# Patient Record
Sex: Male | Born: 1935 | Race: White | Hispanic: No | Smoking: Former smoker
Health system: Southern US, Community
[De-identification: ages and names within clinical notes are randomized; demographics above are authoritative.]

## PROBLEM LIST (undated history)

## (undated) DIAGNOSIS — IMO0002 Reserved for concepts with insufficient information to code with codable children: Secondary | ICD-10-CM

## (undated) DIAGNOSIS — C801 Malignant (primary) neoplasm, unspecified: Secondary | ICD-10-CM

## (undated) DIAGNOSIS — S12500A Unspecified displaced fracture of sixth cervical vertebra, initial encounter for closed fracture: Secondary | ICD-10-CM

## (undated) DIAGNOSIS — I2699 Other pulmonary embolism without acute cor pulmonale: Secondary | ICD-10-CM

## (undated) DIAGNOSIS — M1990 Unspecified osteoarthritis, unspecified site: Secondary | ICD-10-CM

## (undated) HISTORY — DX: Unspecified displaced fracture of sixth cervical vertebra, initial encounter for closed fracture: S12.500A

## (undated) HISTORY — DX: Reserved for concepts with insufficient information to code with codable children: IMO0002

## (undated) HISTORY — DX: Unspecified osteoarthritis, unspecified site: M19.90

## (undated) HISTORY — PX: FACIAL RECONSTRUCTION SURGERY: SHX631

## (undated) HISTORY — PX: OTHER SURGICAL HISTORY: SHX169

---

## 1999-08-19 ENCOUNTER — Emergency Department (HOSPITAL_COMMUNITY): Admission: EM | Admit: 1999-08-19 | Discharge: 1999-08-19 | Payer: Self-pay | Admitting: Emergency Medicine

## 2001-02-17 ENCOUNTER — Ambulatory Visit (HOSPITAL_COMMUNITY): Admission: RE | Admit: 2001-02-17 | Discharge: 2001-02-17 | Payer: Self-pay | Admitting: Internal Medicine

## 2001-05-16 ENCOUNTER — Encounter: Payer: Self-pay | Admitting: Internal Medicine

## 2001-05-16 ENCOUNTER — Ambulatory Visit (HOSPITAL_COMMUNITY): Admission: RE | Admit: 2001-05-16 | Discharge: 2001-05-16 | Payer: Self-pay | Admitting: Internal Medicine

## 2002-08-18 ENCOUNTER — Ambulatory Visit (HOSPITAL_COMMUNITY): Admission: RE | Admit: 2002-08-18 | Discharge: 2002-08-18 | Payer: Self-pay | Admitting: Internal Medicine

## 2005-01-03 ENCOUNTER — Inpatient Hospital Stay (HOSPITAL_COMMUNITY): Admission: AC | Admit: 2005-01-03 | Discharge: 2005-01-04 | Payer: Self-pay

## 2005-02-25 ENCOUNTER — Encounter: Admission: RE | Admit: 2005-02-25 | Discharge: 2005-02-25 | Payer: Self-pay | Admitting: Orthopedic Surgery

## 2005-10-31 ENCOUNTER — Emergency Department (HOSPITAL_COMMUNITY): Admission: EM | Admit: 2005-10-31 | Discharge: 2005-10-31 | Payer: Self-pay | Admitting: Emergency Medicine

## 2005-11-06 ENCOUNTER — Ambulatory Visit (HOSPITAL_COMMUNITY): Admission: RE | Admit: 2005-11-06 | Discharge: 2005-11-06 | Payer: Self-pay | Admitting: Internal Medicine

## 2005-12-30 DIAGNOSIS — I2699 Other pulmonary embolism without acute cor pulmonale: Secondary | ICD-10-CM

## 2005-12-30 DIAGNOSIS — S12500A Unspecified displaced fracture of sixth cervical vertebra, initial encounter for closed fracture: Secondary | ICD-10-CM

## 2005-12-30 HISTORY — DX: Other pulmonary embolism without acute cor pulmonale: I26.99

## 2005-12-30 HISTORY — DX: Unspecified displaced fracture of sixth cervical vertebra, initial encounter for closed fracture: S12.500A

## 2006-01-22 ENCOUNTER — Encounter: Admission: RE | Admit: 2006-01-22 | Discharge: 2006-01-22 | Payer: Self-pay | Admitting: Thoracic Surgery

## 2006-05-14 ENCOUNTER — Encounter: Admission: RE | Admit: 2006-05-14 | Discharge: 2006-05-14 | Payer: Self-pay | Admitting: Thoracic Surgery

## 2006-08-15 ENCOUNTER — Inpatient Hospital Stay (HOSPITAL_COMMUNITY): Admission: EM | Admit: 2006-08-15 | Discharge: 2006-08-20 | Payer: Self-pay | Admitting: Emergency Medicine

## 2006-08-18 ENCOUNTER — Ambulatory Visit: Payer: Self-pay | Admitting: Physical Medicine & Rehabilitation

## 2006-10-22 ENCOUNTER — Inpatient Hospital Stay (HOSPITAL_COMMUNITY): Admission: AD | Admit: 2006-10-22 | Discharge: 2006-10-24 | Payer: Self-pay | Admitting: Internal Medicine

## 2006-10-23 ENCOUNTER — Encounter: Payer: Self-pay | Admitting: Vascular Surgery

## 2006-12-03 ENCOUNTER — Encounter: Admission: RE | Admit: 2006-12-03 | Discharge: 2006-12-03 | Payer: Self-pay | Admitting: Thoracic Surgery

## 2007-05-23 ENCOUNTER — Inpatient Hospital Stay (HOSPITAL_COMMUNITY): Admission: AC | Admit: 2007-05-23 | Discharge: 2007-05-29 | Payer: Self-pay

## 2007-05-26 ENCOUNTER — Ambulatory Visit: Payer: Self-pay | Admitting: Physical Medicine & Rehabilitation

## 2007-06-24 ENCOUNTER — Ambulatory Visit: Payer: Self-pay | Admitting: Thoracic Surgery

## 2007-06-24 ENCOUNTER — Encounter: Admission: RE | Admit: 2007-06-24 | Discharge: 2007-06-24 | Payer: Self-pay | Admitting: General Surgery

## 2007-07-07 ENCOUNTER — Encounter: Admission: RE | Admit: 2007-07-07 | Discharge: 2007-07-07 | Payer: Self-pay | Admitting: Otolaryngology

## 2007-12-16 ENCOUNTER — Ambulatory Visit: Payer: Self-pay | Admitting: Thoracic Surgery

## 2007-12-16 ENCOUNTER — Encounter: Admission: RE | Admit: 2007-12-16 | Discharge: 2007-12-16 | Payer: Self-pay | Admitting: Thoracic Surgery

## 2008-06-15 ENCOUNTER — Encounter: Admission: RE | Admit: 2008-06-15 | Discharge: 2008-06-15 | Payer: Self-pay | Admitting: Thoracic Surgery

## 2008-06-15 ENCOUNTER — Ambulatory Visit: Payer: Self-pay | Admitting: Thoracic Surgery

## 2008-07-15 ENCOUNTER — Inpatient Hospital Stay (HOSPITAL_COMMUNITY): Admission: RE | Admit: 2008-07-15 | Discharge: 2008-07-18 | Payer: Self-pay | Admitting: Orthopedic Surgery

## 2008-07-15 HISTORY — PX: OTHER SURGICAL HISTORY: SHX169

## 2008-12-14 ENCOUNTER — Encounter: Admission: RE | Admit: 2008-12-14 | Discharge: 2008-12-14 | Payer: Self-pay | Admitting: Thoracic Surgery

## 2008-12-14 ENCOUNTER — Ambulatory Visit: Payer: Self-pay | Admitting: Thoracic Surgery

## 2009-09-13 ENCOUNTER — Encounter: Admission: RE | Admit: 2009-09-13 | Discharge: 2009-09-13 | Payer: Self-pay | Admitting: Thoracic Surgery

## 2009-09-13 ENCOUNTER — Ambulatory Visit: Payer: Self-pay | Admitting: Thoracic Surgery

## 2009-09-18 ENCOUNTER — Ambulatory Visit (HOSPITAL_COMMUNITY): Admission: RE | Admit: 2009-09-18 | Discharge: 2009-09-18 | Payer: Self-pay | Admitting: Thoracic Surgery

## 2009-09-20 ENCOUNTER — Ambulatory Visit: Payer: Self-pay | Admitting: Thoracic Surgery

## 2009-09-20 ENCOUNTER — Ambulatory Visit (HOSPITAL_COMMUNITY): Admission: RE | Admit: 2009-09-20 | Discharge: 2009-09-20 | Payer: Self-pay | Admitting: Thoracic Surgery

## 2009-09-27 ENCOUNTER — Ambulatory Visit (HOSPITAL_COMMUNITY): Admission: RE | Admit: 2009-09-27 | Discharge: 2009-09-27 | Payer: Self-pay | Admitting: Thoracic Surgery

## 2009-09-27 ENCOUNTER — Encounter (INDEPENDENT_AMBULATORY_CARE_PROVIDER_SITE_OTHER): Payer: Self-pay | Admitting: Interventional Radiology

## 2009-09-27 DIAGNOSIS — C801 Malignant (primary) neoplasm, unspecified: Secondary | ICD-10-CM

## 2009-09-27 HISTORY — DX: Malignant (primary) neoplasm, unspecified: C80.1

## 2009-10-03 ENCOUNTER — Ambulatory Visit: Payer: Self-pay | Admitting: Thoracic Surgery

## 2009-10-05 ENCOUNTER — Ambulatory Visit: Admission: RE | Admit: 2009-10-05 | Discharge: 2009-12-27 | Payer: Self-pay | Admitting: Radiation Oncology

## 2009-10-13 DIAGNOSIS — IMO0001 Reserved for inherently not codable concepts without codable children: Secondary | ICD-10-CM

## 2009-10-13 HISTORY — DX: Reserved for inherently not codable concepts without codable children: IMO0001

## 2009-10-18 ENCOUNTER — Inpatient Hospital Stay (HOSPITAL_COMMUNITY): Admission: AD | Admit: 2009-10-18 | Discharge: 2009-10-24 | Payer: Self-pay | Admitting: Thoracic Surgery

## 2009-10-19 ENCOUNTER — Encounter: Payer: Self-pay | Admitting: Thoracic Surgery

## 2009-10-19 ENCOUNTER — Ambulatory Visit: Payer: Self-pay | Admitting: Thoracic Surgery

## 2009-10-19 HISTORY — PX: OTHER SURGICAL HISTORY: SHX169

## 2009-11-01 ENCOUNTER — Encounter: Admission: RE | Admit: 2009-11-01 | Discharge: 2009-11-01 | Payer: Self-pay | Admitting: Thoracic Surgery

## 2009-11-01 ENCOUNTER — Ambulatory Visit: Payer: Self-pay | Admitting: Thoracic Surgery

## 2009-11-15 ENCOUNTER — Encounter: Admission: RE | Admit: 2009-11-15 | Discharge: 2009-11-15 | Payer: Self-pay | Admitting: Thoracic Surgery

## 2009-11-15 ENCOUNTER — Ambulatory Visit: Payer: Self-pay | Admitting: Thoracic Surgery

## 2009-12-27 ENCOUNTER — Encounter: Admission: RE | Admit: 2009-12-27 | Discharge: 2009-12-27 | Payer: Self-pay | Admitting: Thoracic Surgery

## 2009-12-27 ENCOUNTER — Ambulatory Visit: Payer: Self-pay | Admitting: Thoracic Surgery

## 2010-02-14 ENCOUNTER — Other Ambulatory Visit: Payer: Self-pay | Admitting: Radiation Oncology

## 2010-02-15 ENCOUNTER — Ambulatory Visit (HOSPITAL_COMMUNITY): Admission: RE | Admit: 2010-02-15 | Discharge: 2010-02-15 | Payer: Self-pay | Admitting: Radiation Oncology

## 2010-03-23 ENCOUNTER — Ambulatory Visit: Payer: Self-pay | Admitting: Thoracic Surgery

## 2010-03-28 IMAGING — CR DG CHEST 1V PORT
1 series · 1 of 1 positions shown · non-contrast
Comparison: Chest radiograph 10/18/2009

CLINICAL DATA: Right lung mass

PORTABLE CHEST - 1 VIEW

[AP]
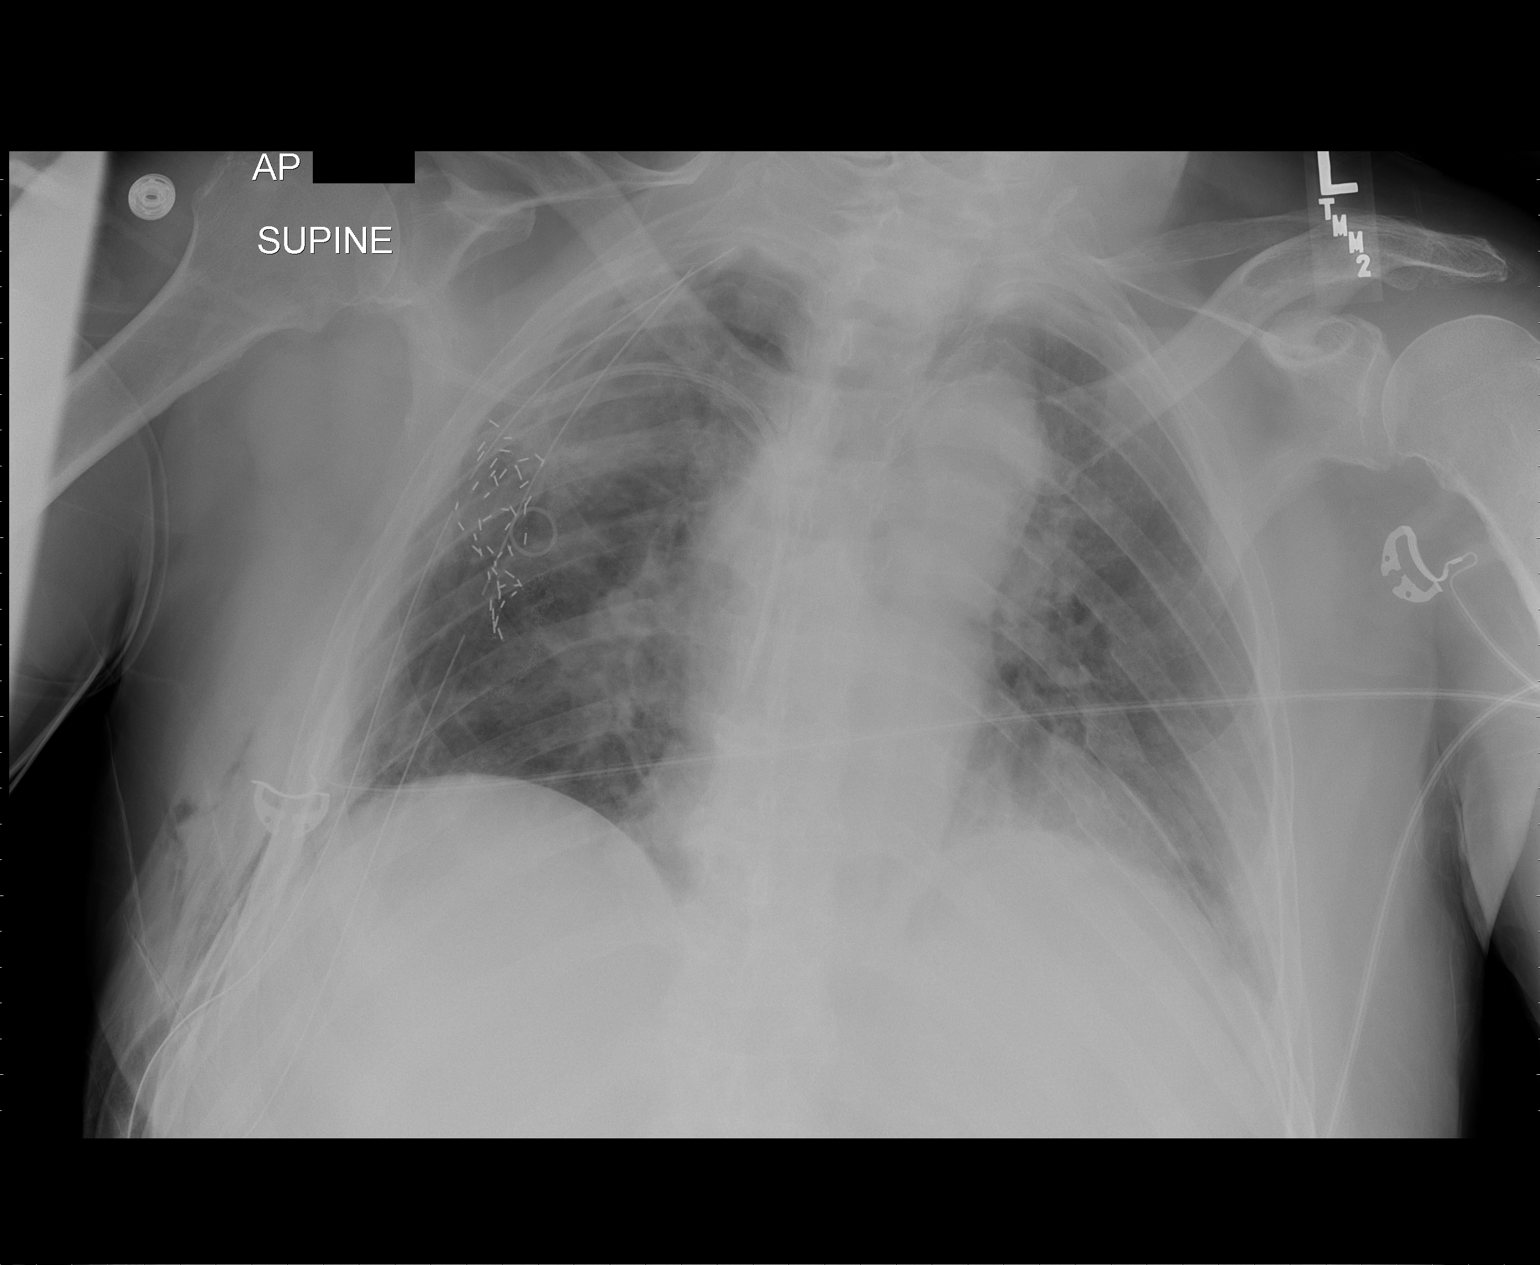

[1 of 1 positions shown; findings below may reference images not displayed]

FINDINGS: Interval placement of a right-sided central venous line
with tip in the distal SVC.  Two right chest tubes in place.  Small
volume subcutaneous gas along the right lateral chest wall.  There
are brachytherapy seeds in the right lateral lung.  No evidence
pneumothorax.  Bibasilar atelectasis.
IMPRESSION: No evidence of complication following right lung surgery with
brachytherapy.

## 2010-03-30 IMAGING — CR DG CHEST 1V PORT
1 series · 1 of 1 positions shown · non-contrast
Comparison: the previous day's study

CLINICAL DATA: Right lung mass

PORTABLE CHEST - 1 VIEW

[view not recorded]
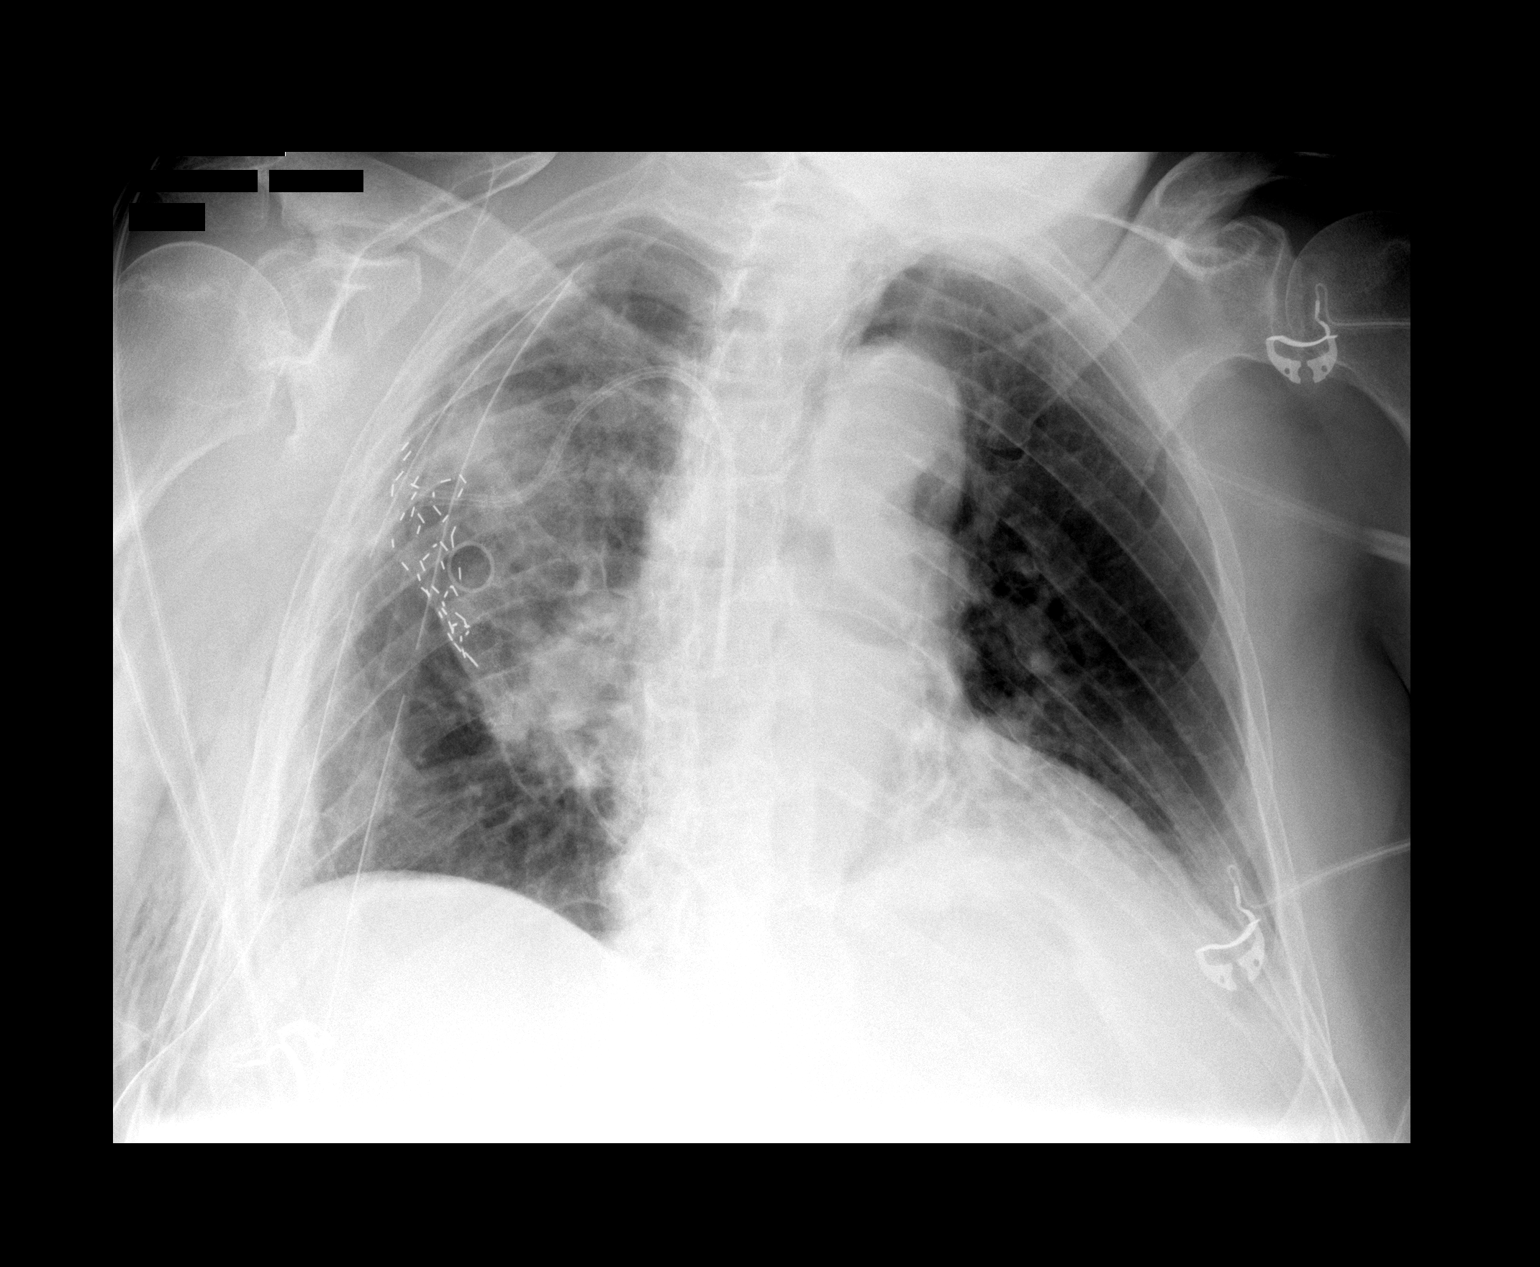

[1 of 1 positions shown; findings below may reference images not displayed]

FINDINGS: Two right chest tubes remain in place with no
pneumothorax.  Radiation seeds project in the right midlung.  Right
subclavian port catheter stable.  Some increase in airspace
opacities in the right mid and upper lung.  Low lung volumes with
resultant crowding of bronchovascular structures.  Cannot exclude
mild pulmonary vascular congestion.  Some increase in patchy
atelectasis or developing infiltrate at the left lung base.  No
definite effusion.  Heart size upper limits normal.
IMPRESSION: 1.  Worsening airspace opacities in the right mid and upper lung
and at the left lung base.
2. Support hardware stable in position.

## 2010-03-31 IMAGING — CR DG CHEST 1V PORT
1 series · 1 of 1 positions shown · non-contrast
Comparison: 10/21/2009

CLINICAL DATA: Right lung cancer.  Right lung mass.  Follow-up
VATS.  Thoracotomy.

PORTABLE CHEST - 1 VIEW

[view not recorded]
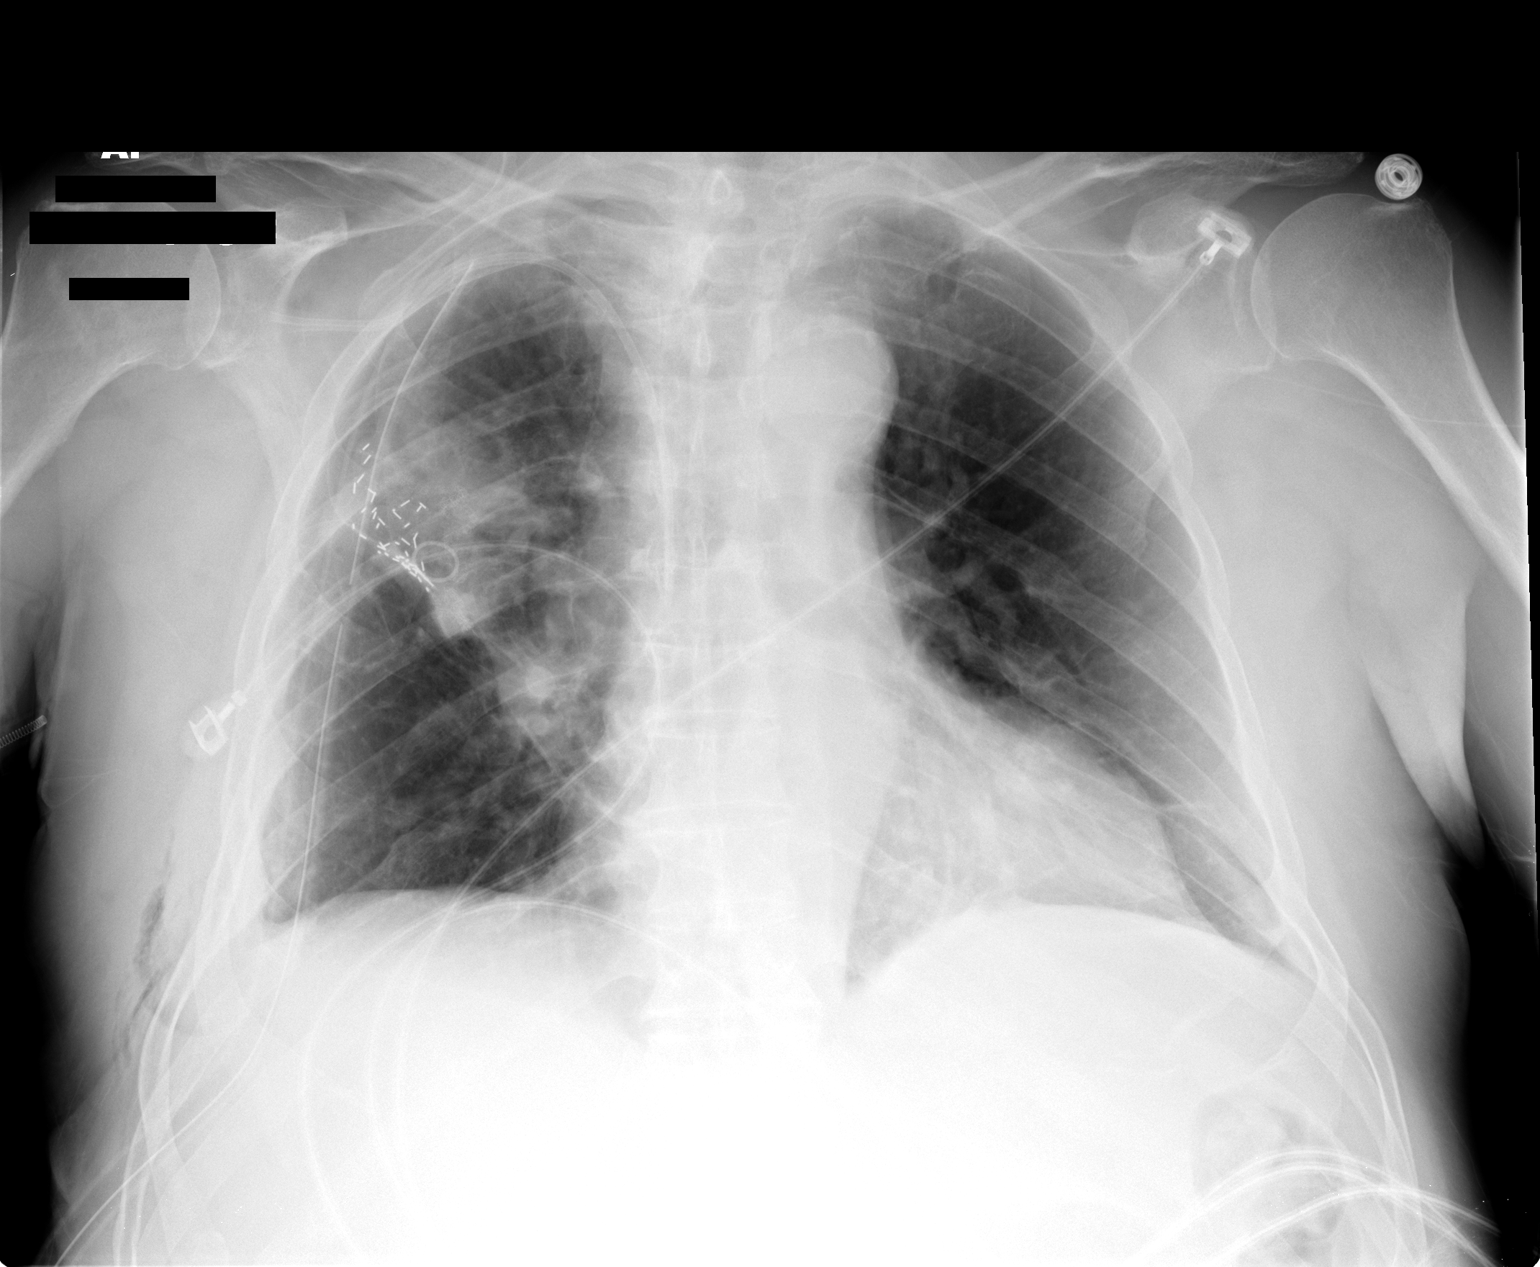

[1 of 1 positions shown; findings below may reference images not displayed]

FINDINGS: Patient has had removal of [DATE] two right-sided chest
tubes.  Right central line tip overlies the level of superior vena
cava.  Right radiation seeds are in place.  There has been some
improvement in right upper lobe air space filling.  There is a
small right pneumothorax.  There is minimal left base atelectasis.
IMPRESSION: Small right pneumothorax following removal of right chest tube.

## 2010-04-24 IMAGING — CR DG CHEST 2V
2 series · 2 of 2 positions shown · non-contrast
Comparison: 11/01/2009

CLINICAL DATA: Lung cancer.

CHEST - 2 VIEW

[w chest pa]
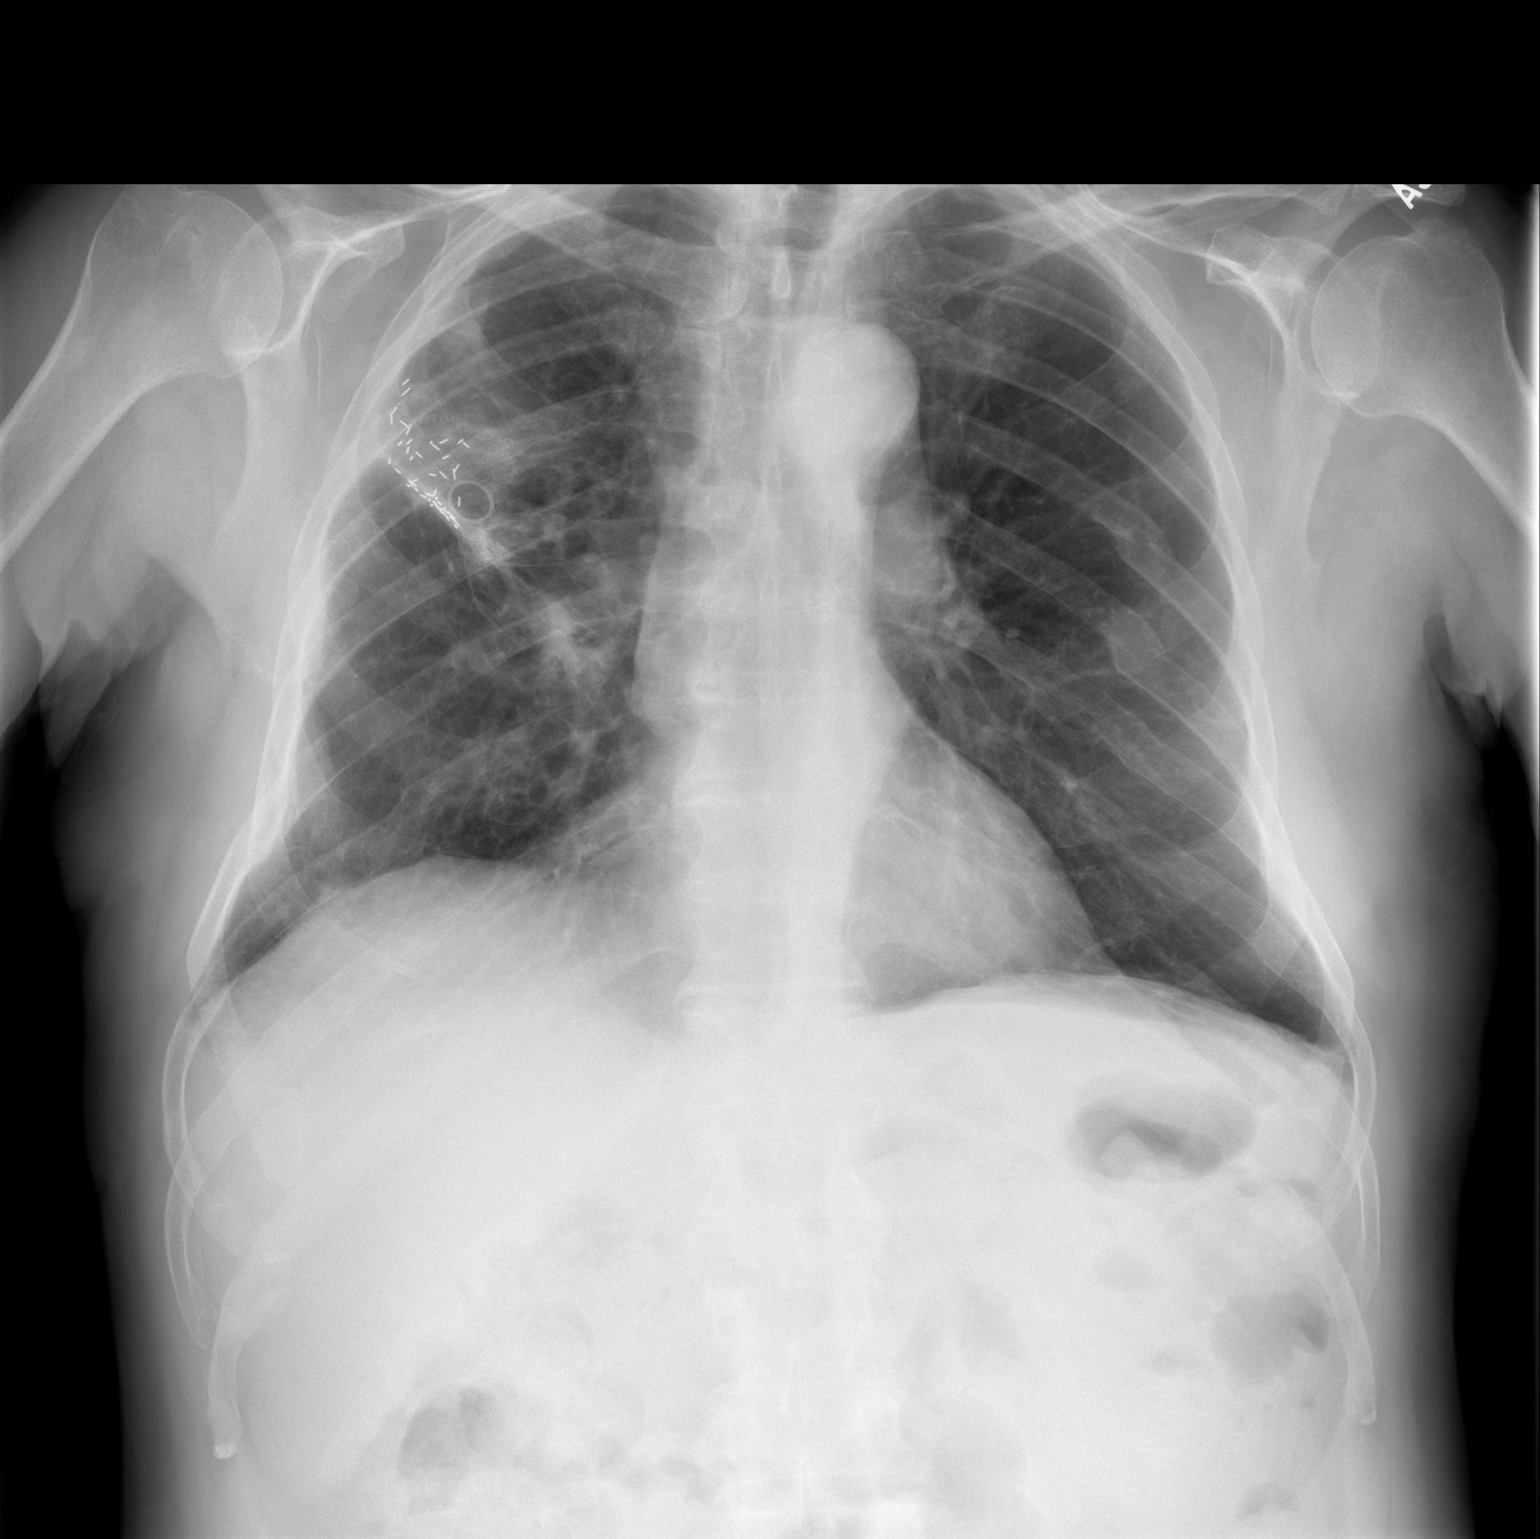

[w chest lat]
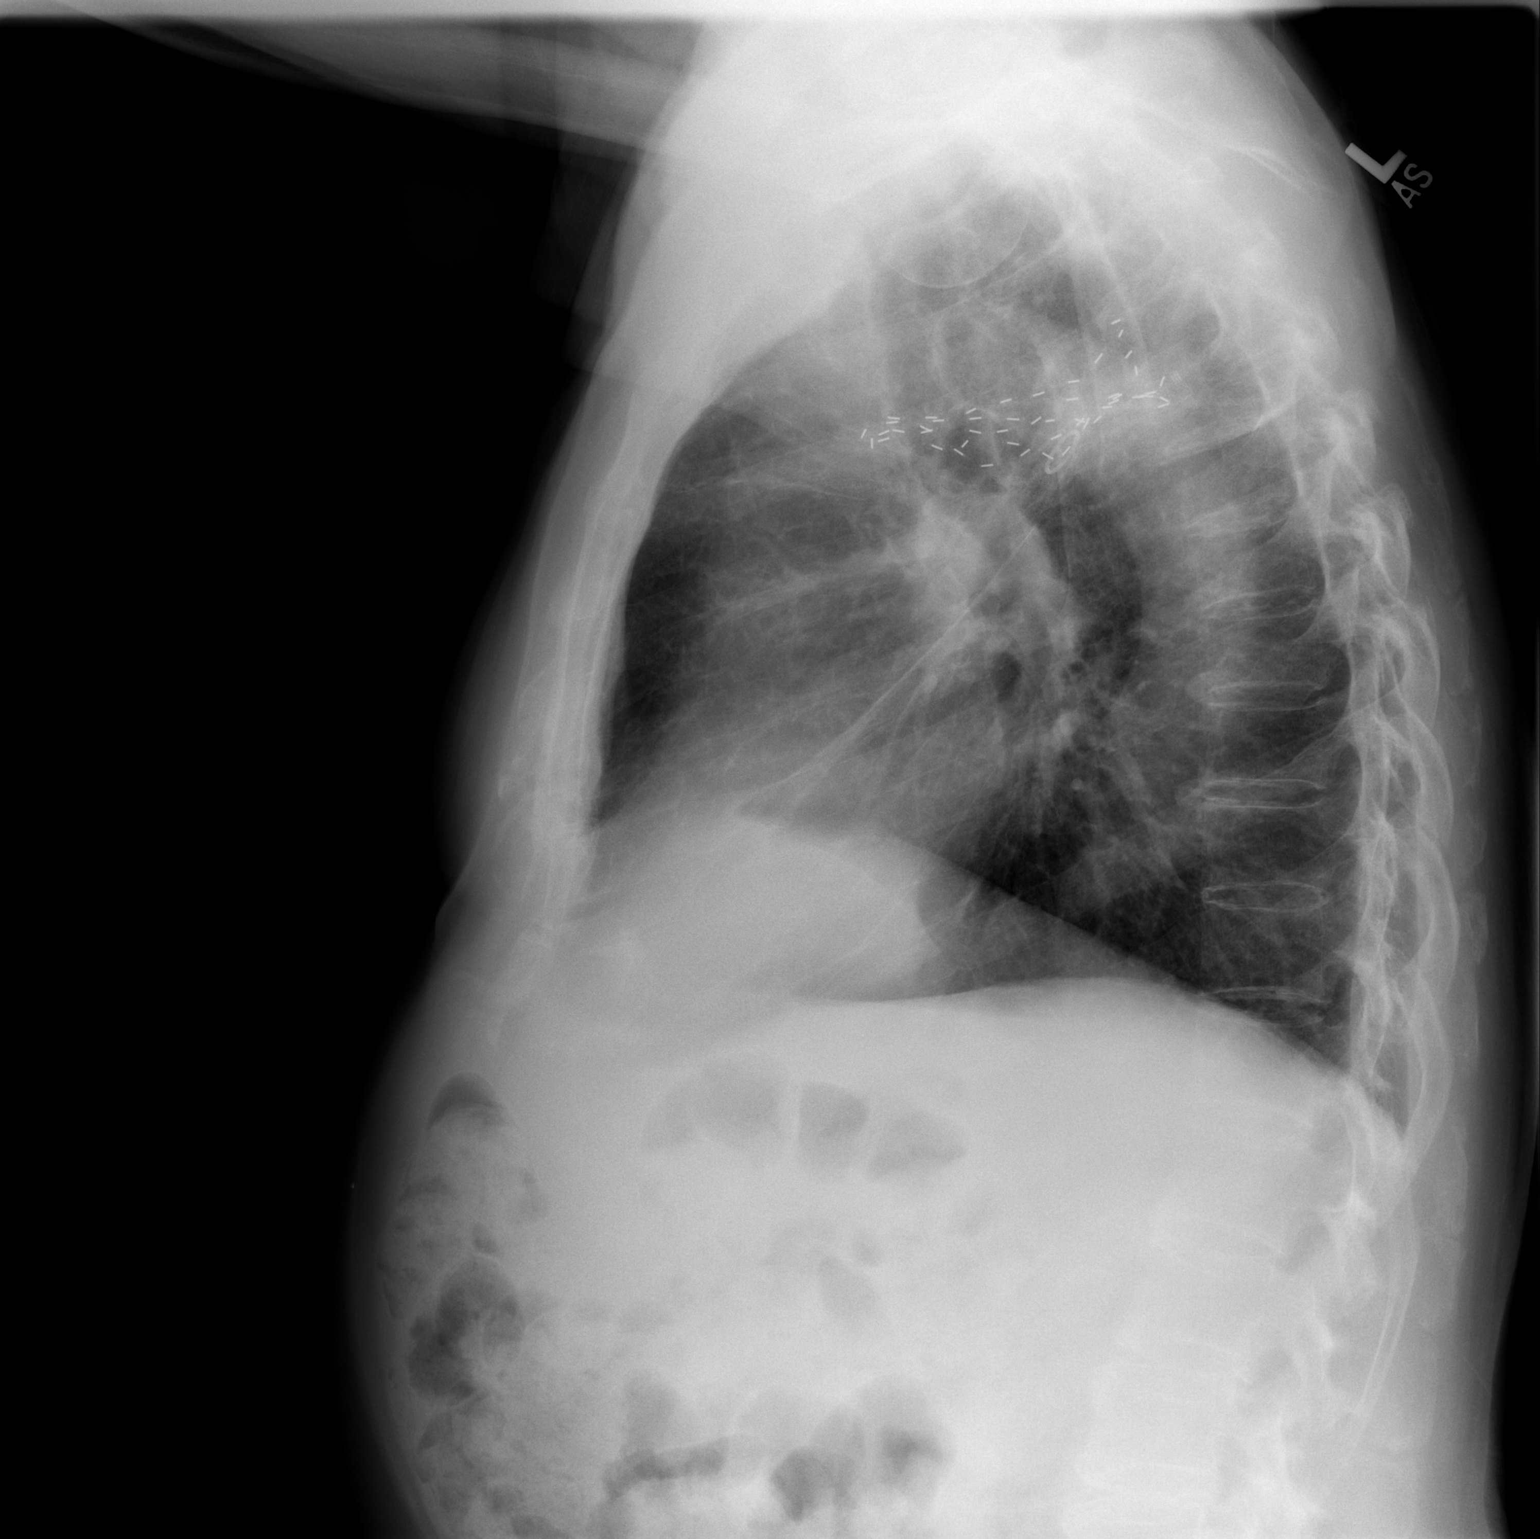

[2 of 2 positions shown; findings below may reference images not displayed]

FINDINGS: Trachea is midline.  Heart size normal.  There are
postoperative changes and radioactive seed placement in right
hemithorax, with associated volume loss.  Left lung is clear.  Old
left rib fractures.  No pleural fluid.
IMPRESSION: Postoperative changes with radioactive seed placement in the right
hemithorax without interval change.

## 2010-05-18 ENCOUNTER — Ambulatory Visit: Admission: RE | Admit: 2010-05-18 | Discharge: 2010-05-18 | Payer: Self-pay | Admitting: Radiation Oncology

## 2010-05-24 ENCOUNTER — Ambulatory Visit (HOSPITAL_COMMUNITY): Admission: RE | Admit: 2010-05-24 | Discharge: 2010-05-24 | Payer: Self-pay | Admitting: Radiation Oncology

## 2010-05-30 ENCOUNTER — Ambulatory Visit: Payer: Self-pay | Admitting: Thoracic Surgery

## 2010-06-08 ENCOUNTER — Ambulatory Visit: Admission: RE | Admit: 2010-06-08 | Discharge: 2010-06-08 | Payer: Self-pay | Admitting: Orthopaedic Surgery

## 2010-06-08 ENCOUNTER — Ambulatory Visit: Payer: Self-pay | Admitting: Vascular Surgery

## 2010-10-04 ENCOUNTER — Ambulatory Visit (HOSPITAL_COMMUNITY): Admission: RE | Admit: 2010-10-04 | Discharge: 2010-10-04 | Payer: Self-pay | Admitting: Radiation Oncology

## 2010-10-04 ENCOUNTER — Other Ambulatory Visit: Payer: Self-pay | Admitting: Radiation Oncology

## 2010-10-04 LAB — BUN: BUN: 13 mg/dL (ref 6–23)

## 2010-12-04 ENCOUNTER — Ambulatory Visit: Payer: Self-pay | Admitting: Thoracic Surgery

## 2011-01-18 ENCOUNTER — Other Ambulatory Visit: Payer: Self-pay | Admitting: Radiation Oncology

## 2011-01-18 DIAGNOSIS — C3490 Malignant neoplasm of unspecified part of unspecified bronchus or lung: Secondary | ICD-10-CM

## 2011-01-19 ENCOUNTER — Encounter: Payer: Self-pay | Admitting: Radiation Oncology

## 2011-01-20 ENCOUNTER — Encounter: Payer: Self-pay | Admitting: Thoracic Surgery

## 2011-01-20 ENCOUNTER — Encounter: Payer: Self-pay | Admitting: Radiation Oncology

## 2011-04-04 LAB — BASIC METABOLIC PANEL
BUN: 13 mg/dL (ref 6–23)
CO2: 25 mEq/L (ref 19–32)
CO2: 29 mEq/L (ref 19–32)
Chloride: 100 mEq/L (ref 96–112)
Chloride: 98 mEq/L (ref 96–112)
Creatinine, Ser: 0.87 mg/dL (ref 0.4–1.5)
Creatinine, Ser: 0.95 mg/dL (ref 0.4–1.5)
GFR calc Af Amer: >60 mL/min (ref >60)
Glucose, Bld: 169 mg/dL — ABNORMAL HIGH (ref 70–99)
Sodium: 132 mEq/L — ABNORMAL LOW (ref 135–145)

## 2011-04-04 LAB — GLUCOSE, CAPILLARY
Glucose-Capillary: 111 mg/dL — ABNORMAL HIGH (ref 70–99)
Glucose-Capillary: 134 mg/dL — ABNORMAL HIGH (ref 70–99)
Glucose-Capillary: 146 mg/dL — ABNORMAL HIGH (ref 70–99)
Glucose-Capillary: 154 mg/dL — ABNORMAL HIGH (ref 70–99)
Glucose-Capillary: 159 mg/dL — ABNORMAL HIGH (ref 70–99)
Glucose-Capillary: 160 mg/dL — ABNORMAL HIGH (ref 70–99)
Glucose-Capillary: 162 mg/dL — ABNORMAL HIGH (ref 70–99)
Glucose-Capillary: 190 mg/dL — ABNORMAL HIGH (ref 70–99)

## 2011-04-04 LAB — CBC
HCT: 32.9 % — ABNORMAL LOW (ref 39.0–52.0)
HCT: 34.9 % — ABNORMAL LOW (ref 39.0–52.0)
HCT: 39 % (ref 39.0–52.0)
Hemoglobin: 12 g/dL — ABNORMAL LOW (ref 13.0–17.0)
MCHC: 34.3 g/dL (ref 30.0–36.0)
MCHC: 34.4 g/dL (ref 30.0–36.0)
MCV: 91.7 fL (ref 78.0–100.0)
MCV: 92.9 fL (ref 78.0–100.0)
MCV: 93 fL (ref 78.0–100.0)
MCV: 93.6 fL (ref 78.0–100.0)
Platelets: 161 10*3/uL (ref 150–400)
RBC: 3.54 MIL/uL — ABNORMAL LOW (ref 4.22–5.81)
RBC: 3.75 MIL/uL — ABNORMAL LOW (ref 4.22–5.81)
RBC: 4.24 MIL/uL (ref 4.22–5.81)
WBC: 11.3 10*3/uL — ABNORMAL HIGH (ref 4.0–10.5)
WBC: 7.1 10*3/uL (ref 4.0–10.5)

## 2011-04-04 LAB — COMPREHENSIVE METABOLIC PANEL
AST: 22 U/L (ref 0–37)
Alkaline Phosphatase: 42 U/L (ref 39–117)
BUN: 10 mg/dL (ref 6–23)
BUN: 12 mg/dL (ref 6–23)
CO2: 24 mEq/L (ref 19–32)
CO2: 28 mEq/L (ref 19–32)
Chloride: 100 mEq/L (ref 96–112)
Chloride: 104 mEq/L (ref 96–112)
Creatinine, Ser: 0.91 mg/dL (ref 0.4–1.5)
Creatinine, Ser: 0.91 mg/dL (ref 0.4–1.5)
GFR calc non Af Amer: >60 mL/min (ref >60)
GFR calc non Af Amer: >60 mL/min (ref >60)
Glucose, Bld: 134 mg/dL — ABNORMAL HIGH (ref 70–99)
Potassium: 4.6 mEq/L (ref 3.5–5.1)
Total Bilirubin: 0.6 mg/dL (ref 0.3–1.2)
Total Bilirubin: 0.8 mg/dL (ref 0.3–1.2)

## 2011-04-04 LAB — PROTIME-INR
INR: 0.99 (ref 0.00–1.49)
INR: 1.06 (ref 0.00–1.49)
Prothrombin Time: 13 seconds (ref 11.6–15.2)
Prothrombin Time: 13.7 seconds (ref 11.6–15.2)
Prothrombin Time: 14 seconds (ref 11.6–15.2)

## 2011-04-04 LAB — APTT: aPTT: 26 seconds (ref 24–37)

## 2011-04-04 LAB — POCT I-STAT 3, ART BLOOD GAS (G3+)
O2 Saturation: 97 %
Patient temperature: 98.6
TCO2: 25 mmol/L (ref 0–100)

## 2011-04-04 LAB — CROSSMATCH: Weak D: POSITIVE

## 2011-04-05 LAB — CBC
HCT: 38.7 % — ABNORMAL LOW (ref 39.0–52.0)
MCHC: 33.9 g/dL (ref 30.0–36.0)
Platelets: 181 10*3/uL (ref 150–400)
RDW: 13 % (ref 11.5–15.5)

## 2011-04-05 LAB — APTT: aPTT: 26 seconds (ref 24–37)

## 2011-04-11 ENCOUNTER — Ambulatory Visit: Payer: Medicare Other | Attending: Radiation Oncology | Admitting: Radiation Oncology

## 2011-04-11 ENCOUNTER — Encounter (HOSPITAL_COMMUNITY): Payer: Self-pay

## 2011-04-11 ENCOUNTER — Ambulatory Visit (HOSPITAL_COMMUNITY)
Admission: RE | Admit: 2011-04-11 | Discharge: 2011-04-11 | Disposition: A | Discharge status: Home / Self Care | Payer: Medicare Other | Source: Ambulatory Visit | Attending: Radiation Oncology | Admitting: Radiation Oncology

## 2011-04-11 DIAGNOSIS — J984 Other disorders of lung: Secondary | ICD-10-CM | POA: Insufficient documentation

## 2011-04-11 DIAGNOSIS — I2510 Atherosclerotic heart disease of native coronary artery without angina pectoris: Secondary | ICD-10-CM | POA: Insufficient documentation

## 2011-04-11 DIAGNOSIS — K7689 Other specified diseases of liver: Secondary | ICD-10-CM | POA: Insufficient documentation

## 2011-04-11 DIAGNOSIS — C3490 Malignant neoplasm of unspecified part of unspecified bronchus or lung: Secondary | ICD-10-CM | POA: Insufficient documentation

## 2011-04-11 HISTORY — DX: Malignant (primary) neoplasm, unspecified: C80.1

## 2011-04-11 LAB — BUN & CREATININE (CHCC)
BUN, Bld: 14 mg/dL (ref 7–22)
Creat: 0.9 mg/dl (ref 0.6–1.2)

## 2011-04-11 MED ORDER — IOHEXOL 300 MG/ML  SOLN
80.0000 mL | Freq: Once | INTRAMUSCULAR | Status: AC | PRN
  Administered 2011-04-11: 80 mL via INTRAVENOUS

## 2011-04-18 ENCOUNTER — Ambulatory Visit: Payer: Medicare Other | Attending: Radiation Oncology | Admitting: Radiation Oncology

## 2011-05-01 ENCOUNTER — Other Ambulatory Visit: Payer: Self-pay | Admitting: Dermatology

## 2011-05-02 ENCOUNTER — Other Ambulatory Visit: Payer: Self-pay | Admitting: Thoracic Surgery

## 2011-05-02 DIAGNOSIS — C3490 Malignant neoplasm of unspecified part of unspecified bronchus or lung: Secondary | ICD-10-CM
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
## 2011-05-14 NOTE — Letter (Signed)
September 13, 2009   Geoffry Paradise, MD  [address]  [place], [place] [address]   Re:  Allen [name], Allen [name]              DOB:  12-14-1923   Dear Dr. Jacky Kindle;   I saw the patient back today for followup of this right upper lobe  lesion that we have been following, we last got a CT scan 9 months ago  and one today does show that there is probably some slight increase in  the right upper lobe lesion, although his previous PET scan was  negative, I am going to repeat his PET scan and get pulmonary function  tests.  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  His blood  pressure is 123/74, pulse 84, respirations 18, sats were 97%.   Ines Bloomer, M.D.  Electronically Signed   DPB/MEDQ  D:  09/13/2009  T:  09/14/2009  Job:  [id number]
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
## 2011-05-14 NOTE — H&P (Signed)
NAMEMEDARD, DECUIR NO.:  [id number]   MEDICAL RECORD NO.:  [id number]        PATIENT TYPE:  CINP   LOCATION:                               FACILITY:  [facility]   PHYSICIAN:  Almedia Balls. Ranell Patrick, M.D. DATE OF BIRTH:  1923/01/03   DATE OF ADMISSION:  07/15/2008  DATE OF DISCHARGE:                              HISTORY & PHYSICAL   CHIEF COMPLAINT:  Left hip pain.   HISTORY OF PRESENT ILLNESS:  The patient is an 77 year old male with  worsening left hip pain.  It has been refractory to conservative  treatment.  The patient has elected have a left total hip arthroplasty.   PAST MEDICAL HISTORY:  Significant for history of bilateral pulmonary  embolus.   FAMILY MEDICAL HISTORY:  Negative.   SOCIAL HISTORY:  The patient of Dr. Jacky Kindle, does not smoke or use  alcohol.   DRUG ALLERGIES:  None.   CURRENT MEDICATIONS:  1. Fish oil.  2. Aspirin 81 mg p.o. daily.   REVIEW OF SYSTEMS:  Pain with ambulation.   PHYSICAL EXAMINATION:  VITAL SIGNS:  Pulse 74, respirations 16, and  blood pressure 130/80.  GENERAL:  The patient is a healthy-appearing 77 year old male, with no  acute distress.  Pleasant mood and affect, oriented x3.  HEAD AND NECK:  Cranial nerves II through XII are grossly intact.  NECK:  Full range of motion without any tenderness.  CHEST:  Active breath sounds bilaterally.  No wheezes, rhonchi, or  rales.  ABDOMEN:  Nontender with nondistended.  HEART:  Regular rate and rhythm.  No murmur.  EXTREMITIES:  Moderate tenderness with internal and external rotation of  the left hip.  He does have a mildly antalgic gait.  SKIN:  No rashes.  He has no pedal edema distally.   LABORATORY DATA:  X-rays show end-stage osteoarthritis of the left hip.   IMPRESSION:  End-stage osteoarthritis of the left hip.   PLAN:  Plan of action is to have a left total hip arthroplasty by Dr.  Malon Kindle.      Thomas B. Durwin Nora, P.A.      Almedia Balls. Ranell Patrick, M.D.  Electronically Signed    TBD/MEDQ  D:  07/06/2008  T:  07/07/2008  Job:  [id number]
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
## 2011-05-14 NOTE — Letter (Signed)
March 23, 2010   Lurline Hare, MD  [address] [place],  [place] [address]   Re:  Allen [name], Allen [name]              DOB:  1923-10-01   Dear Kennyth Arnold,   I saw the patient back today and reviewed his CT scan from February.  I  agree with you that this is probably just related to the seed  implantation.  A PET scan would not be helpful at that time as it  probably would be positive from his previous surgery.  I agree with  getting the next CT scan in late May, and I will see him back again at  that time that is what I informed the patient and his family.  His blood  pressure is 131/77, pulse 86, respirations 18, sats 94%.   Allen [name], M.D.  Electronically Signed   DPB/MEDQ  D:  03/23/2010  T:  03/24/2010  Job:  [id number]

## 2011-05-14 NOTE — Letter (Signed)
December 04, 2010   Lurline Hare, MD  [address] [place], [place] [address]   Re:  Allen, [name]              DOB:  Jul 02, 1923   Dear Kennyth Arnold:   I saw the patient back today.  I am really happy to see his CT scan  after his wedge resection.  Everything appears to be stable.  He is  doing well overall.  I plan to see him again in 6 months with another CT  scan.  I appreciate the opportunity of seeing the patient.  His blood  pressure was 142/85, pulse 76, respirations 18, and sats were 96%.  I  appreciate the opportunity of seeing the patient.   Sincerely,   Ines Bloomer, M.D.  Electronically Signed   DPB/MEDQ  D:  12/04/2010  T:  12/04/2010  Job:  [id number]

## 2011-05-14 NOTE — Letter (Signed)
May 30, 2010   Lurline Hare, MD  [address] [place], [place] [address]   Re:  Allen [name], Allen [name]              DOB:  1923/02/02   Dear Kennyth Arnold,   I saw the patient in the office today and his followup CT scan as I  suspect did show improvement as far as a reaction around his seeds and I  do not see any evidence of any recurrence at this time.  He has had a  recent mini stroke apparently affecting his right side and for that  reason he is on Coumadin.  Overall, he is stable.  His blood pressure  was 150/83, pulse 82, respirations 18, and sats were 97%.  I will plan  to see him back again in 6 months and repeat his CT scan at that time.   Ines Bloomer, M.D.  Electronically Signed   DPB/MEDQ  D:  05/30/2010  T:  05/30/2010  Job:  [id number]   cc:   Geoffry Paradise, M.D.

## 2011-05-14 NOTE — Assessment & Plan Note (Signed)
OFFICE VISIT   BELL, CAI  DOB:  1923-11-13                                        December 14, 2008  CHART #:  [id number]   HISTORY:  The patient is an 77 year old gentleman who Dr. Edwyna Shell has  been following since 2006 for a right lung mass.  On today's date, he  was seen following a CT scan, which revealed the lesion to be stable in  appearance.  There were no new findings.  He reports that he is having  no new symptoms relating to his pulmonary system.  He denies cough,  sputum production, or hemoptysis.  He denies shortness of breath.   PHYSICAL EXAMINATION:  VITAL SIGNS:  Blood pressure 139/88, pulse 74,  respirations 18, and oxygen saturation is 96% on room air.  GENERAL:  Elderly white male in no acute distress.  PULMONARY:  Clear lungs throughout.  CARDIAC:  Regular rate and rhythm.  Soft systolic aortic murmur.   ASSESSMENT:  The patient is quite stable clinically.  His CT scan  reveals no change in previous findings or new significant findings.  It  is Dr. Scheryl Darter opinion that we should repeat the chest CT scan in 9  months.   Rowe Clack, P.A.-C.   Sherryll Burger  D:  12/14/2008  T:  12/14/2008  Job:  [id number]   cc:   Ines Bloomer, M.D.

## 2011-05-14 NOTE — Letter (Signed)
December 27, 2009   Geoffry Paradise, MD  [address]  [place], [place] [address]   Re:  Allen [name], Allen [name]              DOB:  08-07-1923   Dear Carmine Savoy,   I saw the patient came back today.  He said that his INR was up and that  is being adjusted.  His chest x-ray looks good.  He stopped his  amiodarone and has no more problems with arrhythmias.  His blood  pressure is 135/82, pulse 88, respirations 18, sats were 95%.  From my  standpoint, he is doing well.  I will see him back again in 3 months and  get his first CT scan since his surgery.   Ines Bloomer, M.D.  Electronically Signed   DPB/MEDQ  D:  12/27/2009  T:  12/28/2009  Job:  [id number]
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
## 2011-05-14 NOTE — Letter (Signed)
December 16, 2007   Geoffry Paradise, M.D.  [address]  [place], [place] [address]   Re:  Allen, [name]              DOB:  1923-03-03   Dear Raiford Noble:   I saw Mr. Greathouse in the office today and reviewed his CT scan and his  right upper lobe lesion remains stable.  He still complains of dyspnea  and shortness of breath.  His blood pressure is 134/81, pulse 77,  respirations 18, sats were 96%.  I think he had a recent CT scan that  was negative.  I am just wondering if this pain he is having might not  be angina  and it might be beneficial to do a stress test on him.  From  my standpoint though his lung nodule is stable and I will see him back  in 6 months with a CT scan.   Sincerely,   Ines Bloomer, M.D.  Electronically Signed   DPB/MEDQ  D:  12/16/2007  T:  12/17/2007  Job:  [id number]

## 2011-05-14 NOTE — Assessment & Plan Note (Signed)
OFFICE VISIT   Allen [name], Allen [name]  DOB:  01-Feb-1923                                        November 15, 2009  CHART #:  [id number]   The patient came for followup today.  He continues to improve.  He has  been on his amiodarone for 4 weeks and will continue that for 2 more  weeks.  His incision is well healed.  His lungs are clear to  auscultation and percussion.  His blood pressure is 112/69, pulse 80,  respirations 18, sats were 96%.  We told him to gradually increase his  activities, and he could start climbing stairs.  We will see him back  again in 6 weeks with a chest x-ray.   Ines Bloomer, M.D.  Electronically Signed   DPB/MEDQ  D:  11/15/2009  T:  11/16/2009  Job:  [id number]

## 2011-05-14 NOTE — Letter (Signed)
October 03, 2009   Geoffry Paradise, MD  [address]  [place], [place] [address]   Re:  NICOLI, NARDOZZI              DOB:  05/30/1923   Dear Dr. 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  I have recommended that  he have a wedge resection of this with seed implantation and refer him  to Dr. Kathrynn Running or Dr. Michell Heinrich for evaluation.  We tentatively plan to  do this on the 21st.  We will stop his Coumadin 4 days before and then  bring in the hospital a day before surgery to start him on heparin with  the idea of minimizing his amount of heparin, because of his multiple  problems with pulmonary embolus.   I appreciate the opportunity of seeing the patient.    Sincerely,   Ines Bloomer, M.D.  Electronically Signed   DPB/MEDQ  D:  10/03/2009  T:  10/04/2009  Job:  [id number]

## 2011-05-14 NOTE — Letter (Signed)
September 20, 2009   Geoffry Paradise, M.D.  [address]  [place], [place] [address]   Re:  [name], BEAUMIER              DOB:  Mar 17, 1923   Dear Dr. Jacky Kindle:   I saw the patient back today.  As you know, we have been following him  for at least 4 years for this right upper lobe lesion.  It has slowly  getting bigger, so we went ahead and got a PET scan on him.  The  standard uptake value was 1.5 however, because of its increasing size, I  have recommended that he get a needle biopsy of this.  I think this is  probably at a high chance that this maybe bronchoalveolar cancer if that  is the case, then we will have to decide whether to do a wedge resection  on this or SBRT.  I appreciate the opportunity of seeing the patient and  I will let you know what our biopsy findings show.   Sincerely,   Ines Bloomer, M.D.  Electronically Signed   DPB/MEDQ  D:  09/20/2009  T:  09/21/2009  Job:  [id number]
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
## 2011-05-14 NOTE — Letter (Signed)
November 01, 2009   Geoffry Paradise, MD  [facility]  [address], [place] [address]   Re:  [name], Allen [name]              DOB:  October 26, 1923   Dear Dr. Jacky Kindle:   I saw the patient back in the office today.  He is doing well after his  wedge resection and seed implantation.  He is doing well overall.  His  blood pressure was 136/79, pulse 73, respirations 18, and sats were 95%.  From our standpoint, I think there is no further therapy that needs to  be done.  Even though, he had some atypical adenomatous hyperplasia near  the staple line with the seeds and everything we should take care of  that without a problem.  His chest x-ray showed normal postoperative  changes with seed in good position.  His blood pressure was 136/79,  pulse 72, respirations 18, and sats were 96%.  I removed his chest tube  sutures.  We will see him back again in 2 weeks.   Sincerely,   Ines Bloomer, M.D.  Electronically Signed   DPB/MEDQ  D:  11/01/2009  T:  11/02/2009  Job:  [id number]   cc:   Lajuana Matte, MD  Lurline Hare, MD

## 2011-05-14 NOTE — Op Note (Signed)
Allen [name], Allen [name]   MEDICAL RECORD NO.:  [name]          PATIENT TYPE:  INP   LOCATION:  5007                         FACILITY:  [facility]   PHYSICIAN:  Almedia Balls. Ranell Patrick, M.D. DATE OF BIRTH:  [birth year]   DATE OF PROCEDURE:  07/15/2008  DATE OF DISCHARGE:                               OPERATIVE REPORT   PREOPERATIVE DIAGNOSIS:  Left hip end-stage osteoarthritis.   POSTOPERATIVE DIAGNOSIS:  Left hip end-stage osteoarthritis.   PROCEDURE PERFORMED:  Left total hip replacement using DePuy trial  prosthesis.   SURGEON:  Almedia Balls. Ranell Patrick, MD   ASSISTANT:  Modesto Charon, PA-C   General anesthesia was used.   ESTIMATED BLOOD LOSS:  400 mL.   FLUID REPLACEMENT:  1800 mL crystalloid.   URINE OUTPUT:  350 mL.   INSTRUMENT COUNTS:  Correct.   No complications.   Preoperative antibiotics were given.   INDICATIONS:  The patient is an 77 year old male who presents with end-  stage arthritis of the hip.  The patient had progressive pain and  functional loss.  The patient presents now for operative treatment to  restore function and eliminate pain.  Informed consent was obtained.   DESCRIPTION OF PROCEDURE:  After an adequate level of anesthesia was  achieved, the patient was positioned in right lateral decubitus position  with the left hip up.  The down leg was padded appropriately.  Left hip  sterilely prepped and draped in usual manner.  A posterior approach  Kocher Langenbeck incision was created starting at the vastus ridge and  extending along the gluteus maximus fiber direction.  Dissection was  carried sharply down through subcutaneous tissues.  Tensor fascia lata  divided in line with the skin incision and the gluteus maximus fibers  split.  We identified the gluteus medius and tracked that and then  divided the short external rotators including piriformis.  We tacked  that as well as posterior capsule, dislocated the hip,  performed a neck  cut using a oscillating saw using a neck cut guide 1 fingerbreadth above  the lesser trochanter.  At this point, we went ahead and retracted the  femur anterior to the acetabulum performed a circumferential removal of  the acetabular labrum.  There was significant posterior osteophytes  which were removed as well as anterior osteophytes.  Once we defined the  true acetabulum and the true floor, we were able to prepare the  acetabulum with sequential reaming up to a size 53, gaining good  peripheral fit.  Next, we trialed a 54 Pinnacle sector cup.  We were  happy with the orientation with appropriate anteversion in horizontal  coverage.  We placed a trial 0 liner for the 54 cup which was for the  metal-on-metal, it was 36.  Next, we went ahead and prepared the femur  with initial cookie cutter, then lateralizing reamer, and then  sequential broaching up to a size 5 Tri-Lock stem.  We had about 20  degrees of anteversion dealt into the femoral side.  We then reduced the  hip  initially with a 1.5 stem like that was little tight.  We went ahead  and went to a -2 head and this was the high offset stem.  We were happy  with our leg length, soft tissue balance and stability.  We went ahead  and removed all trial components.  We went ahead and impacted the  Pinnacle sector cup into place again with appropriate anteversion in  horizontal coverage and then placed a single dome screw into the ilium  as well as a hole eliminator and placed the metal-on-metal 54 x 36 metal  liner into place.  We impacted that and then went ahead and placed the  real size 5 Tri-Lock stem with about 20 degrees of anteversion.  Combined anteversion mean about 45 degrees.  Next, we went ahead and  retrialed with the -2 head and we were happy with the soft tissue  balance and leg lengths and stability.  We went ahead and took real -2  head ball, 36 metal-on-metal and impacted that on trunnion, reduced  the  hip, repaired the posterior capsule, and the short external rotators to  the gluteus medius at the level of greater trochanter, and then went  ahead and closed the tensor fascia lata with interrupted #1 Vicryl  suture followed by 0 Vicryl subcutaneous, 2-0 Vicryl subcutaneous, and 4-  0 Monocryl for skin.  Steri-Strips were applied followed by a sterile  dressing.  The patient tolerated the surgery well.      Almedia Balls. Ranell Patrick, M.D.  Electronically Signed     SRN/MEDQ  D:  07/15/2008  T:  07/15/2008  Job:  [id number]

## 2011-05-14 NOTE — Discharge Summary (Signed)
Allen [name], HOLNESS NO.:  [id number]   MEDICAL RECORD NO.:  [id number]          PATIENT TYPE:  INP   LOCATION:  5007                         FACILITY:  [facility]   PHYSICIAN:  Almedia Balls. Ranell Patrick, M.D. DATE OF BIRTH:  1923/10/13   DATE OF ADMISSION:  07/15/2008  DATE OF DISCHARGE:  07/18/2008                               DISCHARGE SUMMARY   ADMISSION DIAGNOSIS:  Left hip end-stage osteoarthritis.   DISCHARGE DIAGNOSIS:  Left hip end-stage osteoarthritis.   BRIEF HISTORY:  The patient is an 77 year old male with worsening left  hip secondary to osteoarthritis. The patient elected to have a left  total hip arthroplasty by Dr. Almedia Balls. Norris.   PROCEDURE:  The patient had a left total hip arthroplasty using DePuy  Trilock system on July 15, 2008. Surgeon was Malon Kindle, M.D.;  assistant was Standley Dakins, P.A.-C.  Estimated blood loss was 400 mL. No  complications.   HOSPITAL COURSE:  The patient was admitted July 15, 2008 for the above  stated procedure which he tolerated well.  After surgery the patient was  taken to the post-anesthesia care unit and was transferred up to 5000.  On postoperative day #1, the patient complained about moderate pain  through the hip. His labs were within acceptable limits.  Neurovascularly, he was intact. Dressing has healed well. On  postoperative day 2 and 3, the patient was somewhat slow to go with  physical therapy but was able to improve greatly on postoperative day 3.  His labs were in acceptable limits with some mild postoperative anemia  but nothing that required transfusion. Thus, the patient was discharged  to a skilled nursing facility on July 18, 2008.   DISCHARGE PLAN:  Discharged to skilled nursing facility July 18, 2008.   CONDITION:  Stable.   DIET:  Regular.   FOLLOW UP:  The patient to follow back up with Dr. Malon Kindle in 7-10  days   __________  Robaxin 500 mg p.o. q.6 h.  Coumadin per pharmacy  protocol.  Meclizine 25 mg p.o. q.6h. p.r.n.  Percocet 5/325 1-2 tablets q.4-6 h. p.r.n. pain.      Thomas B. Dixon, P.A.       ______________________________  Almedia Balls. Ranell Patrick, M.D.    TBD/MEDQ  D:  07/18/2008  T:  07/18/2008  Job:  [id number]

## 2011-05-17 NOTE — H&P (Signed)
NAMEEUAL, LINDSTROM NO.:  [id number]   MEDICAL RECORD NO.:  [id number]          PATIENT TYPE:  INP   LOCATION:  6706                         FACILITY:  [facility]   PHYSICIAN:  Gaspar Garbe, M.D.DATE OF BIRTH:  November 22, 1923   DATE OF ADMISSION:  10/22/2006  DATE OF DISCHARGE:                                HISTORY & PHYSICAL   Audio too short to transcribe (less than 5 seconds)      Gaspar Garbe, M.D.     RWT/MEDQ  D:  10/22/2006  T:  10/22/2006  Job:  [id number]

## 2011-05-17 NOTE — H&P (Signed)
NAMEORVILE, CORONA NO.:  [id number]   MEDICAL RECORD NO.:  [id number]          PATIENT TYPE:  INP   LOCATION:  6706                         FACILITY:  [facility]   PHYSICIAN:  Gaspar Garbe, M.D.DATE OF BIRTH:  1923/09/19   DATE OF ADMISSION:  10/22/2006  DATE OF DISCHARGE:                                HISTORY & PHYSICAL   Please merge this with #[id number], which is a duplicate account for him,  listed under the full name Jarek Longton.   CHIEF COMPLAINT:  Pulmonary embolus.   HISTORY OF PRESENT ILLNESS:  The patient is an 77 year old white male with a  history of a recent fall and a C6 cervical fracture.  He has been at home  following rehabilitation at a nursing facility and fairly active.  He  complained of some chest pains and has had a chest x-ray per Dr. Jacky Kindle.  As these pains continued, he was sent for a CT angiogram as an outpatient at  [facility] today.  I was called with the results of his report,  which indicated that it is suspicious for bilateral upper lobe pulmonary  embolus based because of decreased opacification in both upper lobe  pulmonary arteries with segmental branches demonstrating decreased  opacification, as well, which is concerning for thrombus.  This was read by  Dr. Stephani Police at [facility] and the report was called to me.  I had the patient come by our office for further evaluation and he described  that he has been having a consistently worsening pain, mostly of his  shoulders and upper chest over the past couple of weeks.  He denies any  recent immobilization, but thinks that his left leg may be a little bit  larger than his right, although he denies any red streakiness or any  tenderness to the leg.  The patient is being admitted for pulmonary embolus  to complete his workup for administration of IV heparin and initiation of  Coumadin.   ALLERGIES:  NO KNOWN DRUG ALLERGIES.   MEDICATIONS:  The  patient uses a stool softener, Vicodin p.r.n., and wears a  cervical collar, but is not otherwise on any chronic medications and is  generally healthy.   PAST MEDICAL HISTORY:  1. C-spine fracture, status post fall in August 2007.  2. The patient has had a right upper lobe density, which has been followed      by Karle Plumber at [facility] and has been found to be stable.   SOCIAL HISTORY:  The patient lives in [place] with his wife.  He is married  with 3 children and works as a Engineer, maintenance.  He is a former Occupational hygienist.  He is a  nonsmoker, nondrinker.   FAMILY HISTORY:  Not consistent for any vascular abnormalities or cancers.  Denies any history of blood clots either in legs or lungs of any close  relatives.   REVIEW OF SYSTEMS:  The patient denies any fevers, chills, or sweats.  Denies any headache.  Indicates some shortness of breath and chest pains, as  noted  above, but no overt dyspnea on exertion.  Indicates that his left leg  is somewhat larger, but not having any edema of his ankles.  Denies any  bowel changes.  Denies any new urinary symptoms or heat or cold intolerance.  Other systems are all negative.   ADVANCE DIRECTIVES:  The patient is a full code.   PHYSICAL EXAMINATION:  VITAL SIGNS:  Temperature 98.1, pulse 80, respiratory  rate 18, blood pressure 126/72, satting 96% on room air, weight 147 pounds.  GENERAL:  No acute distress.  HEENT:  Normocephalic, atraumatic, PERRLA, EOMI.  ENT is within normal  limits.  NECK:  The patient is in a soft cervical spine collar.  When removed, the  neck is supple without lymphadenopathy.  LUNGS:  Clear to auscultation bilaterally.  HEART:  Regular rate and rhythm.  No murmur, rub, or gallop are appreciated.  ABDOMEN:  Soft, nontender, normoactive bowel sounds.  No hepatosplenomegaly.  EXTREMITIES:  No clubbing, cyanosis, or edema.  NEUROLOGICAL:  Oriented x3.  Cranial nerves II-XII are intact.  MUSCULOSKELETAL:  No joint deformities,  effusions, or tenderness are noted.   TESTING:  Currently he has a CBC, CMET, PT/PTT pending upon admission.  CT  angiogram with results as noted above.   ASSESSMENT AND PLAN:  1. The patient will be admitted to [facility] for initiation of IV heparin.      This will be dosed per pharmacy and we will start him on Coumadin with      a loading dose of 10 mg tonight with further adjustments to be done per      pharmacy.  His goal INR will be between 2 and 3.  Once the patient has      reached his goal, IV heparin will be discontinued and the patient will      be allowed to return home with followup in our Coumadin clinic at      [facility].  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  In      addition, he is to receive counseling per nutritionist regarding      vitamin K and its effect on Coumadin.  If he develops any further      shortness of breath, will consider interventional radiology where this      evening he will have a lower extremity Doppler.  If there is clot in      place, will consider placement of a Greenfield filter.  If it is      negative, he will be allowed to walk.  2. C-spine fracture of C6.  Will continue him on his soft collar as      needed.  3. I have written for p.r.n.'s for bowels, for sleeping, and for pain.  4. Will place on Protonix during his time in the hospital for      gastrointestinal prophylaxis.      Gaspar Garbe, M.D.  Electronically Signed     RWT/MEDQ  D:  10/22/2006  T:  10/23/2006  Job:  [id number]   cc:   Geoffry Paradise, M.D.

## 2011-06-19 ENCOUNTER — Other Ambulatory Visit: Payer: Medicare Other

## 2011-06-19 ENCOUNTER — Ambulatory Visit (INDEPENDENT_AMBULATORY_CARE_PROVIDER_SITE_OTHER): Payer: Medicare Other | Admitting: Thoracic Surgery

## 2011-06-19 DIAGNOSIS — C3410 Malignant neoplasm of upper lobe, unspecified bronchus or lung: Secondary | ICD-10-CM

## 2011-07-01 NOTE — Assessment & Plan Note (Signed)
OFFICE VISIT  Allen [name], Allen [name] DOB:  1923-12-30                                        June 19, 2011 CHART #:  [id number]  The patient came today and CT scan reviewed in April showed no change as far as recurrence of his cancer.  There is still some scarring.  He has now been over 5 and 18 months since we did his surgery.  We plan to see him back again in 6 months and repeat another CT scan which will be in approximately 2 years and he is on Coumadin now for his atrial fibrillation, but otherwise, he is doing well.  His blood pressure is 146/87, pulse 74, respirations 20, sats were 97.  Ines Bloomer, M.D. Electronically Signed  DPB/MEDQ  D:  06/19/2011  T:  06/20/2011  Job:  [id number]  cc:   Lurline Hare, M.D. Geoffry Paradise, M.D.

## 2011-09-27 LAB — CBC
HCT: 26.5 — ABNORMAL LOW
HCT: 27.4 — ABNORMAL LOW
HCT: 31.8 — ABNORMAL LOW
Hemoglobin: 10.9 — ABNORMAL LOW
Hemoglobin: 9.4 — ABNORMAL LOW
MCHC: 34.2
MCV: 92.2
MCV: 92.3
Platelets: 132 — ABNORMAL LOW
Platelets: 182
RBC: 2.98 — ABNORMAL LOW
RDW: 12.6
RDW: 12.9
RDW: 13.1
WBC: 7.4

## 2011-09-27 LAB — BASIC METABOLIC PANEL
BUN: 13
BUN: 8
CO2: 25
CO2: 26
CO2: 28
Chloride: 101
Chloride: 102
Chloride: 103
Creatinine, Ser: 0.92
GFR calc Af Amer: >60
GFR calc Af Amer: >60
GFR calc non Af Amer: >60
GFR calc non Af Amer: >60
Glucose, Bld: 141 — ABNORMAL HIGH
Glucose, Bld: 161 — ABNORMAL HIGH
Potassium: 4
Potassium: 4.1
Potassium: 4.6
Sodium: 132 — ABNORMAL LOW
Sodium: 134 — ABNORMAL LOW
Sodium: 137

## 2011-09-27 LAB — URINALYSIS, ROUTINE W REFLEX MICROSCOPIC
Ketones, ur: NEGATIVE
Nitrite: NEGATIVE
Protein, ur: NEGATIVE
Urobilinogen, UA: 0.2

## 2011-09-27 LAB — DIFFERENTIAL
Basophils Absolute: 0
Lymphocytes Relative: 19
Neutro Abs: 5.2

## 2011-09-27 LAB — TYPE AND SCREEN

## 2011-09-27 LAB — PROTIME-INR
INR: 1
Prothrombin Time: 12.9

## 2011-09-27 LAB — APTT: aPTT: 28

## 2011-10-22 ENCOUNTER — Other Ambulatory Visit: Payer: Self-pay | Admitting: Radiation Oncology

## 2011-10-22 DIAGNOSIS — C3490 Malignant neoplasm of unspecified part of unspecified bronchus or lung: Secondary | ICD-10-CM

## 2011-10-23 ENCOUNTER — Ambulatory Visit (HOSPITAL_COMMUNITY)
Admission: RE | Admit: 2011-10-23 | Discharge: 2011-10-23 | Disposition: A | Discharge status: Home / Self Care | Payer: Medicare Other | Source: Ambulatory Visit | Attending: Radiation Oncology | Admitting: Radiation Oncology

## 2011-10-23 DIAGNOSIS — C3490 Malignant neoplasm of unspecified part of unspecified bronchus or lung: Secondary | ICD-10-CM | POA: Insufficient documentation

## 2011-10-23 DIAGNOSIS — R05 Cough: Secondary | ICD-10-CM | POA: Insufficient documentation

## 2011-10-23 DIAGNOSIS — R059 Cough, unspecified: Secondary | ICD-10-CM | POA: Insufficient documentation

## 2011-10-23 DIAGNOSIS — Z923 Personal history of irradiation: Secondary | ICD-10-CM | POA: Insufficient documentation

## 2011-10-23 DIAGNOSIS — R0602 Shortness of breath: Secondary | ICD-10-CM | POA: Insufficient documentation

## 2011-10-23 MED ORDER — IOHEXOL 300 MG/ML  SOLN
100.0000 mL | Freq: Once | INTRAMUSCULAR | Status: AC | PRN
  Administered 2011-10-23: 100 mL via INTRAVENOUS

## 2011-10-24 ENCOUNTER — Ambulatory Visit
Admission: RE | Admit: 2011-10-24 | Discharge: 2011-10-24 | Disposition: A | Discharge status: Home / Self Care | Payer: Medicare Other | Source: Ambulatory Visit | Attending: Radiation Oncology | Admitting: Radiation Oncology

## 2011-12-11 ENCOUNTER — Ambulatory Visit: Payer: Medicare Other | Admitting: Thoracic Surgery

## 2012-01-01 ENCOUNTER — Encounter: Payer: Self-pay | Admitting: Thoracic Surgery

## 2012-01-01 ENCOUNTER — Ambulatory Visit (INDEPENDENT_AMBULATORY_CARE_PROVIDER_SITE_OTHER): Payer: Medicare Other | Admitting: Thoracic Surgery

## 2012-01-01 VITALS — BP 141/82 | HR 80 | Resp 20 | Ht 64.0 in | Wt 156.0 lb

## 2012-01-01 DIAGNOSIS — C3490 Malignant neoplasm of unspecified part of unspecified bronchus or lung: Secondary | ICD-10-CM

## 2012-01-01 NOTE — Progress Notes (Signed)
HPI patient returns for followup. He had a CT scan done 2 months ago and it was negative his neck skin is scheduled in August no evidence of recurrence of his cancer he is doing well overall except for vision problems. I informed him that I will let  Dr. Michell Heinrich follow him.   Current Outpatient Prescriptions  Medication Sig Dispense Refill  . aspirin 81 MG tablet Take 160 mg by mouth daily.        . Lite Touch Lancets MISC       . Probiotic Product (PROBIOTIC + OMEGA-3) CAPS Take 1 tablet by mouth 1 day or 1 dose.        . traMADol (ULTRAM) 50 MG tablet       . warfarin (COUMADIN) 5 MG tablet Take 5 mg by mouth daily.           Review of Systems: Continued problems with vision   Physical Exam lungs are clear attestation percussion   Diagnostic Tests: CT scan showed no evidence of recurrence   Impression: Status post resection a stage I A. adenocarcinoma right upper lobe was seed implantation followup with Dr. Michell Heinrich  Plan: As above

## 2012-10-28 ENCOUNTER — Other Ambulatory Visit: Payer: Self-pay | Admitting: Radiation Oncology

## 2012-10-28 DIAGNOSIS — C3490 Malignant neoplasm of unspecified part of unspecified bronchus or lung: Secondary | ICD-10-CM

## 2012-10-29 ENCOUNTER — Telehealth: Payer: Self-pay | Admitting: Radiation Oncology

## 2012-10-29 ENCOUNTER — Ambulatory Visit: Payer: Medicare Other | Admitting: Radiation Oncology

## 2012-10-29 ENCOUNTER — Ambulatory Visit (HOSPITAL_COMMUNITY)
Admission: RE | Admit: 2012-10-29 | Discharge: 2012-10-29 | Disposition: A | Discharge status: Home / Self Care | Payer: Medicare Other | Source: Ambulatory Visit | Attending: Radiation Oncology | Admitting: Radiation Oncology

## 2012-10-29 ENCOUNTER — Encounter: Payer: Self-pay | Admitting: Radiation Oncology

## 2012-10-29 ENCOUNTER — Ambulatory Visit
Admission: RE | Admit: 2012-10-29 | Discharge: 2012-10-29 | Disposition: A | Discharge status: Home / Self Care | Payer: Medicare Other | Source: Ambulatory Visit | Attending: Radiation Oncology | Admitting: Radiation Oncology

## 2012-10-29 ENCOUNTER — Encounter (HOSPITAL_COMMUNITY): Payer: Self-pay

## 2012-10-29 VITALS — BP 123/73 | HR 70 | Temp 97.8°F | Resp 20 | Wt 147.0 lb

## 2012-10-29 DIAGNOSIS — M1990 Unspecified osteoarthritis, unspecified site: Secondary | ICD-10-CM | POA: Insufficient documentation

## 2012-10-29 DIAGNOSIS — K7689 Other specified diseases of liver: Secondary | ICD-10-CM | POA: Insufficient documentation

## 2012-10-29 DIAGNOSIS — I2699 Other pulmonary embolism without acute cor pulmonale: Secondary | ICD-10-CM | POA: Insufficient documentation

## 2012-10-29 DIAGNOSIS — Z923 Personal history of irradiation: Secondary | ICD-10-CM | POA: Insufficient documentation

## 2012-10-29 DIAGNOSIS — S12500A Unspecified displaced fracture of sixth cervical vertebra, initial encounter for closed fracture: Secondary | ICD-10-CM | POA: Insufficient documentation

## 2012-10-29 DIAGNOSIS — C3490 Malignant neoplasm of unspecified part of unspecified bronchus or lung: Secondary | ICD-10-CM | POA: Insufficient documentation

## 2012-10-29 DIAGNOSIS — C801 Malignant (primary) neoplasm, unspecified: Secondary | ICD-10-CM

## 2012-10-29 HISTORY — DX: Other pulmonary embolism without acute cor pulmonale: I26.99

## 2012-10-29 MED ORDER — IOHEXOL 300 MG/ML  SOLN
80.0000 mL | Freq: Once | INTRAMUSCULAR | Status: AC | PRN
  Administered 2012-10-29: 80 mL via INTRAVENOUS

## 2012-10-29 NOTE — Progress Notes (Signed)
Pt denies pain, fatigue, loss of appetite, cough, SOB. He reports he's had dizziness past 5 days which resolves on it's own, but he states "it has lasted longer than it usually does". Pt's daughter states he's had dizziness in past. Pt denies nausea, HA, vision changes, unsteady gate.

## 2012-10-29 NOTE — Telephone Encounter (Signed)
Message copied by Agnes Lawrence on Thu Oct 29, 2012  4:27 PM ------      Message from: Lurline Hare      Created: Thu Oct 29, 2012  2:44 PM       Looks great! Sam- can you call with results? SW

## 2012-10-29 NOTE — Telephone Encounter (Signed)
Per Dr. Luciano Cutter request phoned patient at home with results of CT scan of the chest. Informed patient the scan shows no recurrence or metastasis. Patient verbalized understanding and great appreciation for the call. Routed message to Dr. Michell Heinrich.

## 2012-10-30 NOTE — Progress Notes (Signed)
   Department of Radiation Oncology  Phone:  [phone] Fax:        [phone]   Name: Allen [name]   DOB: November 03, 1923  MRN: [id number]    Date: 10/30/2012  Follow Up Visit Note  Diagnosis: T1 non-small cell lung cancer of the right upper lobe  Interval since last radiation: 3 years  Interval History: Allen [name] presents today for routine followup.  He is doing well and feeling well. He still struggles with vertigo which has been a chronic issue for him. He had a CT scan earlier this morning which shows no evidence of disease recurrence. His breathing symptoms are stable. He still pretty much running the farm. Accompanied by his daughter today.  Allergies: No Known Allergies  Medications:  Current Outpatient Prescriptions  Medication Sig Dispense Refill  . aspirin 81 MG tablet Take 160 mg by mouth daily.        Marland Kitchen BEE POLLEN PO Take by mouth.      . Lite Touch Lancets MISC       . Probiotic Product (PROBIOTIC + OMEGA-3) CAPS Take 1 tablet by mouth 1 day or 1 dose.        . traMADol (ULTRAM) 50 MG tablet       . warfarin (COUMADIN) 5 MG tablet Take 5 mg by mouth daily.         No current facility-administered medications for this encounter.   Facility-Administered Medications Ordered in Other Encounters  Medication Dose Route Frequency Provider Last Rate Last Dose  . iohexol (OMNIPAQUE) 300 MG/ML solution 80 mL  80 mL Intravenous Once PRN Medication Radiologist, MD   80 mL at 10/29/12 1225    Physical Exam:   weight is 147 lb (66.679 kg). His oral temperature is 97.8 F (36.6 C). His blood pressure is 123/73 and his pulse is 70. His respiration is 20 and oxygen saturation is 97%.  He is in no respiratory distress. He is alert minus x3.  IMPRESSION: Allen [name] is a 78 y.o. male status post wedge resection of brachytherapy seed implantation with no evidence of disease  PLAN:  Allen [name] looks great. I scheduled him for CT in one year. This will finish his followup with me. He knows to  contact me with any questions or concerns.    Lurline Hare, MD

## 2012-12-08 ENCOUNTER — Telehealth: Payer: Self-pay | Admitting: Radiation Oncology

## 2012-12-08 NOTE — Telephone Encounter (Signed)
Faxed FUP 04/19/11, 10/24/11 to [facility], [phone].  OK per SW.  Received confirmation.

## 2013-10-07 ENCOUNTER — Telehealth: Payer: Self-pay | Admitting: *Deleted

## 2013-10-07 ENCOUNTER — Ambulatory Visit: Payer: Medicare Other

## 2013-10-07 ENCOUNTER — Other Ambulatory Visit: Payer: Self-pay | Admitting: Radiation Oncology

## 2013-10-07 DIAGNOSIS — C801 Malignant (primary) neoplasm, unspecified: Secondary | ICD-10-CM

## 2013-10-07 NOTE — Telephone Encounter (Signed)
CALLED PATIENT TO ASK ABOUT COMING IN FOR LABS, LVM FOR A RETURN CALL 

## 2013-10-08 ENCOUNTER — Ambulatory Visit
Admission: RE | Admit: 2013-10-08 | Discharge: 2013-10-08 | Disposition: A | Discharge status: Home / Self Care | Payer: Medicare Other | Source: Ambulatory Visit | Attending: Radiation Oncology | Admitting: Radiation Oncology

## 2013-10-08 DIAGNOSIS — C801 Malignant (primary) neoplasm, unspecified: Secondary | ICD-10-CM | POA: Insufficient documentation

## 2013-10-08 LAB — BUN AND CREATININE (CC13): Creatinine: 1 mg/dL (ref 0.7–1.3)

## 2013-10-20 ENCOUNTER — Other Ambulatory Visit: Payer: Self-pay | Admitting: Radiation Oncology

## 2013-10-20 DIAGNOSIS — C801 Malignant (primary) neoplasm, unspecified: Secondary | ICD-10-CM

## 2013-10-28 ENCOUNTER — Telehealth: Payer: Self-pay | Admitting: *Deleted

## 2013-10-28 ENCOUNTER — Ambulatory Visit: Payer: Medicare Other | Admitting: Radiation Oncology

## 2013-10-28 NOTE — Telephone Encounter (Signed)
Called patient to ask question, lvm for a return call 

## 2013-10-29 ENCOUNTER — Ambulatory Visit (HOSPITAL_COMMUNITY): Payer: Medicare Other

## 2013-11-04 ENCOUNTER — Ambulatory Visit (HOSPITAL_COMMUNITY)
Admission: RE | Admit: 2013-11-04 | Discharge: 2013-11-04 | Disposition: A | Discharge status: Home / Self Care | Payer: Medicare Other | Source: Ambulatory Visit | Attending: Radiation Oncology | Admitting: Radiation Oncology

## 2013-11-04 ENCOUNTER — Ambulatory Visit: Payer: Medicare Other | Admitting: Radiation Oncology

## 2013-11-05 ENCOUNTER — Ambulatory Visit (HOSPITAL_COMMUNITY)
Admission: RE | Admit: 2013-11-05 | Discharge: 2013-11-05 | Disposition: A | Discharge status: Home / Self Care | Payer: Medicare Other | Source: Ambulatory Visit | Attending: Radiation Oncology | Admitting: Radiation Oncology

## 2013-11-05 ENCOUNTER — Ambulatory Visit
Admission: RE | Admit: 2013-11-05 | Discharge: 2013-11-05 | Disposition: A | Discharge status: Home / Self Care | Payer: Medicare Other | Source: Ambulatory Visit | Attending: Radiation Oncology | Admitting: Radiation Oncology

## 2013-11-05 VITALS — BP 135/68 | HR 76 | Temp 97.8°F | Wt 154.1 lb

## 2013-11-05 DIAGNOSIS — C3490 Malignant neoplasm of unspecified part of unspecified bronchus or lung: Secondary | ICD-10-CM | POA: Insufficient documentation

## 2013-11-05 DIAGNOSIS — Z923 Personal history of irradiation: Secondary | ICD-10-CM | POA: Insufficient documentation

## 2013-11-05 DIAGNOSIS — C801 Malignant (primary) neoplasm, unspecified: Secondary | ICD-10-CM

## 2013-11-05 DIAGNOSIS — K7689 Other specified diseases of liver: Secondary | ICD-10-CM | POA: Insufficient documentation

## 2013-11-05 MED ORDER — IOHEXOL 300 MG/ML  SOLN
80.0000 mL | Freq: Once | INTRAMUSCULAR | Status: AC | PRN
  Administered 2013-11-05: 80 mL via INTRAVENOUS

## 2013-11-05 NOTE — Progress Notes (Signed)
Patient for routine follow up radiation to righ tupper lobe lung cancer (nsclca).Denies pain, nausea or shortness of breath.Had ct of chest today which is stable.No recurrent or metastatic disease.

## 2013-11-05 NOTE — Progress Notes (Signed)
   Department of Radiation Oncology  Phone:  [phone] Fax:        [phone]   Name: Allen [name] MRN: [id number]  DOB: 1923-06-23  Date: 11/05/2013  Follow Up Visit Note  Diagnosis: T1N0 NSCLC of the right upper lobe   Interval History: Allen [name] presents today for routine followup.  He is feeling well. He has no pain no nausea no headaches no hemoptysis. His breathing symptoms are stable. He had a chest CT earlier this afternoon which is no evidence of disease recurrence. He is accompanied by his daughter today.   Allergies: No Known Allergies  Medications:  Current Outpatient Prescriptions  Medication Sig Dispense Refill  . aspirin 81 MG tablet Take 160 mg by mouth daily.        . cholecalciferol (VITAMIN D) 1000 UNITS tablet Take 1,000 Units by mouth daily.      . fish oil-omega-3 fatty acids 1000 MG capsule Take 2 g by mouth daily.      . multivitamin-lutein (OCUVITE-LUTEIN) CAPS capsule Take 1 capsule by mouth daily.      . Probiotic Product (PROBIOTIC + OMEGA-3) CAPS Take 1 tablet by mouth 1 day or 1 dose.        . rivaroxaban (XARELTO) 10 MG TABS tablet Take 10 mg by mouth daily. 20 mg daily      . traMADol (ULTRAM) 50 MG tablet       . Lite Touch Lancets MISC       . warfarin (COUMADIN) 5 MG tablet Take 5 mg by mouth daily.         No current facility-administered medications for this encounter.    Physical Exam:  Filed Vitals:   11/05/13 1525  BP: 135/68  Pulse: 76  Temp: 97.8 F (36.6 C)   he is alert and oriented x3. He has normal respiratory effort.  IMPRESSION: Allen [name] is a 79 y.o. male status post wedge resection and brachytherapy for an early stage non-small cell lung cancer with no evidence of disease now 4 years out from treatment  PLAN:  Mr. Symonette looks great. I think the chance of recurrence after 4 years is very low. I have released him from followup with me. I don't really see the utility and further imaging without symptoms. He has regularly scheduled  followup with his primary care physician. I encouraged his daughter to contact me with any questions or concerns in the future. She agreed to do so. He was grateful for his care.    Lurline Hare, MD

## 2016-01-05 ENCOUNTER — Encounter: Payer: Self-pay | Admitting: Internal Medicine

## 2016-04-02 ENCOUNTER — Ambulatory Visit: Payer: PPO | Admitting: Podiatry

## 2016-10-17 ENCOUNTER — Ambulatory Visit (INDEPENDENT_AMBULATORY_CARE_PROVIDER_SITE_OTHER): Payer: PPO | Admitting: Podiatry

## 2016-10-17 ENCOUNTER — Other Ambulatory Visit: Payer: Self-pay | Admitting: *Deleted

## 2016-10-17 ENCOUNTER — Encounter: Payer: Self-pay | Admitting: Podiatry

## 2016-10-17 DIAGNOSIS — M79676 Pain in unspecified toe(s): Secondary | ICD-10-CM

## 2016-10-17 DIAGNOSIS — B351 Tinea unguium: Secondary | ICD-10-CM | POA: Diagnosis not present

## 2016-10-17 NOTE — Progress Notes (Signed)
   Subjective:    Patient ID: Allen [name], male    DOB: [birth year], 82 y.o.   MRN: [id number]  HPI: He presents today chief complaint of painful elongated toenails.    Review of Systems  Musculoskeletal: Positive for arthralgias.  Neurological: Positive for dizziness.  Hematological: Bruises/bleeds easily.  Psychiatric/Behavioral:       Memory Loss  All other systems reviewed and are negative.      Objective:   Physical Exam: Vital signs are stable he is alert and oriented 3 pulses are palpable. Neurologic sensorium is intact. Deep tendon flexors are intact. Muscle strength is normal bilateral. Orthopedic evaluation was resolved with the sling for range of motion or crepitation. He has hammertoe deformity is flex 1 age bilateral. No open lesions or wounds are noted. That he does have thick yellow dystrophic with mycotic nails which is painful and brittle upon debridement.        Assessment & Plan:  Pain limb secondary to onychomycosis.  Plan: Debridement of toenails 1 through 5 bilateral. Follow up with him in 3 months

## 2016-11-18 ENCOUNTER — Other Ambulatory Visit: Payer: Self-pay | Admitting: Orthopedic Surgery

## 2016-11-18 DIAGNOSIS — S27329A Contusion of lung, unspecified, initial encounter: Secondary | ICD-10-CM

## 2016-11-18 DIAGNOSIS — R071 Chest pain on breathing: Secondary | ICD-10-CM

## 2016-11-19 ENCOUNTER — Ambulatory Visit
Admission: RE | Admit: 2016-11-19 | Discharge: 2016-11-19 | Disposition: A | Discharge status: Home / Self Care | Payer: PPO | Source: Ambulatory Visit | Attending: Orthopedic Surgery | Admitting: Orthopedic Surgery

## 2016-11-19 DIAGNOSIS — S27329A Contusion of lung, unspecified, initial encounter: Secondary | ICD-10-CM

## 2016-11-25 ENCOUNTER — Encounter: Payer: Self-pay | Admitting: Cardiothoracic Surgery

## 2016-11-25 ENCOUNTER — Other Ambulatory Visit: Payer: Self-pay | Admitting: *Deleted

## 2016-11-25 ENCOUNTER — Institutional Professional Consult (permissible substitution) (INDEPENDENT_AMBULATORY_CARE_PROVIDER_SITE_OTHER): Payer: PPO | Admitting: Cardiothoracic Surgery

## 2016-11-25 VITALS — BP 146/77 | HR 84 | Resp 20 | Ht 64.0 in | Wt 148.0 lb

## 2016-11-25 DIAGNOSIS — R918 Other nonspecific abnormal finding of lung field: Secondary | ICD-10-CM

## 2016-11-25 NOTE — Progress Notes (Signed)
Thoracic Location of Tumor / Histology: right mid lung NSCLC    Patient presented with left chest pain   Biopsies revealed:   10/19/09 1. LUNG, RIGHT UPPER LOBE, WEDGE RESECTION: - WELL DIFFERENTIATED ADENOCARCINOMA WITH BRONCHIOALVEOLAR FEATURES, 1.5 CM. - NO ANGIOLYMPHATIC INVASION OR VISCERAL PLEURAL INVOLVEMENT IDENTIFIED. - ATYPICAL ADENOMATOUS HYPERPLASIA IN THE ADJACENT AREAS. - RESECTION MARGIN, NEGATIVE FOR ADENOCARCINOMA. - PLEASE SEE COMMENT.  2. LYMPH NODE, 10R, EXCISIONAL BIOPSY: - ONE LYMPH NODE, NEGATIVE FOR METASTATIC CARCINOMA (0/1).  3. LYMPH NODE, 11R, BIOPSY: - ONE LYMPH NODE, NEGATIVE FOR METASTATIC CARCINOMA (0/1).  Tobacco/Marijuana/Snuff/ETOH use: former smoker  Past/Anticipated interventions by cardiothoracic surgery, if any: 10/19/2009 - right VATS wedge resection of right upper lobe lesion with seed implantation and node dissection  Past/Anticipated interventions by medical oncology, if any:   Signs/Symptoms  Weight changes, if any:   Respiratory complaints, if any:   Hemoptysis, if any:   Pain issues, if any:    SAFETY ISSUES:  Prior radiation?   Pacemaker/ICD?    Possible current pregnancy?no  Is the patient on methotrexate?   Current Complaints / other details:

## 2016-11-25 NOTE — Progress Notes (Signed)
[address]       [place] [address]             [phone]                    Allen [name] [place] [facility] Medical Record #[id number] Date of Birth: January 22, 1923  Referring: Burnard Bunting, MD Primary Care: Geoffery Lyons, MD  Chief Complaint:    Chief Complaint  Patient presents with  . Lung Mass    Surgical eval Pleural based mass in the right mid lung surrounding , Chest CT 11/19/16, HX of right upper lobe wedge resection, HX of lung cancer     History of Present Illness:    Allen [name] 82 y.o. male is seen in the office  today for Referred by Dr. Berenice Primas. The patient family notes that he falls frequently. About a month ago he fell and complained of pain on his left side. He was seen last week by orthopedics because of persistent pain in his left shoulder and left arm, especially with movement. He denies any right chest wall pain. He has a previous history of wedge resection of adenocarcinoma in a nonsmoker with seed implant by Dr. Arlyce Dice October 2010.  The patient comes to the office today because the CT scan done by orthopedics of the chest revealed enlarging right chest mass with lung destruction. On review of the CT there is no obvious explanation for the left shoulder and arm pain.  The patient is alert and able to give his history in good detail, there was family notes that his cognitive functions have declined recently.      Current Activity/ Functional Status:  Patient is not independent with mobility/ambulation, transfers, ADL's, IADL's.   Zubrod Score: At the time of surgery this patient's most appropriate activity status/level should be described as: '[]'$     0    Normal activity, no symptoms '[]'$     1    Restricted in physical strenuous activity but ambulatory, able to do out light work '[x]'$     2    Ambulatory and capable of self care, unable to do work activities, up and about               >50 % of waking hours                              '[]'$      3    Only limited self care, in bed greater than 50% of waking hours '[]'$     4    Completely disabled, no self care, confined to bed or chair '[]'$     5    Moribund   Past Medical History:  Diagnosis Date  . Cancer ([place]) 09/27/09   lung ca, RUL  . Fracture of cervical vertebra, C6 ([place]) 2007  . Osteoarthritis   . Pulmonary embolus ([facility] [facility]) 2007   bilat  . Radiation 10/13/2009   Seed Implant of Right Upper Lobe Lung    Past Surgical History:  Procedure Laterality Date  . FACIAL RECONSTRUCTION SURGERY    . left total hip arthroplasty  07/15/2008   [place]  . Left total hip replacement using DePuy trial    . right VATS wedge resection of right upper lobe lesion with seed implantation and node dissection  10/19/2009   [place], brachytherapy seed placement    Family History  Problem Relation Age of  Onset  . Uterine cancer Sister   . Lung cancer Sister     Social History   Social History  . Marital status: Married    Spouse name: N/A  . Number of children: 3  . Years of education: N/A   Occupational History  . dairy farmer Retired   Social History Main Topics  . Smoking status: Former Smoker    Types: Cigarettes  . Smokeless tobacco: Never Used  . Alcohol use No  . Drug use: No  . Sexual activity: Not on file   Other Topics Concern  . Not on file   Social History Narrative   Former pilot    History  Smoking Status  . Former Smoker  . Types: Cigarettes  Smokeless Tobacco  . Never Used    History  Alcohol Use No     No Known Allergies  Current Outpatient Prescriptions  Medication Sig Dispense Refill  . aspirin 81 MG tablet Take 160 mg by mouth daily.      . cholecalciferol (VITAMIN D) 1000 UNITS tablet Take 1,000 Units by mouth daily.    . fish oil-omega-3 fatty acids 1000 MG capsule Take 2 g by mouth daily.    Marland Kitchen HYDROcodone-acetaminophen (NORCO/VICODIN) 5-325 MG tablet 1 tablet every 4 (four) hours as needed.     . lidocaine (LIDODERM) 5 % Place 1 patch  onto the skin every 12 (twelve) hours.     . multivitamin-lutein (OCUVITE-LUTEIN) CAPS capsule Take 1 capsule by mouth daily.    . Probiotic Product (PROBIOTIC + OMEGA-3) CAPS Take 1 tablet by mouth 1 day or 1 dose.      . rivaroxaban (XARELTO) 10 MG TABS tablet Take 10 mg by mouth daily. 20 mg daily     No current facility-administered medications for this visit.       Review of Systems:     Cardiac Review of Systems: Y or N  Chest Pain [  y  ]  Resting SOB [   ]n Exertional SOB  Blue.Reese  ]  Orthopnea [ n ]   Pedal Edema [ y  ]    Palpitations [  n] Syncope  [  n]   Presyncope [n   ]  General Review of Systems: [Y] = yes [  ]=no Constitional: recent weight change [ n ];  Wt loss over the last 3 months [   ] anorexia [  ]; fatigue [ y ]; nausea [  ]; night sweats [  ]; fever [  ]; or chills [  ];          Dental: poor dentition[  ]; Last Dentist visit:   Eye : blurred vision [  ]; diplopia [   ]; vision changes [  ];  Amaurosis fugax[  ]; Resp: cough [  ];  wheezing[  ];  hemoptysis[  ]; shortness of breath[  ]; paroxysmal nocturnal dyspnea[  ]; dyspnea on exertion[y  ]; or orthopnea[  ];  GI:  gallstones[  ], vomiting[  ];  dysphagia[  ]; melena[  ];  hematochezia [  ]; heartburn[  ];   Hx of  Colonoscopy[  ]; GU: kidney stones [  ]; hematuria[  ];   dysuria [  ];  nocturia[  ];  history of     obstruction [  ]; urinary frequency [  ]             Skin: rash, swelling[  ];, hair loss[n  ];  peripheral edema[  ];  or itching[  ]; Musculosketetal: myalgias[  ];  joint swelling[  n];  joint erythema[  ];  joint pain[  ];  back pain[y  ];  Heme/Lymph: bruising[  ];  bleeding[  ];  anemia[  ];  Neuro: TIA[ n ];  headaches[n  ];  stroke[  n];  vertigo[  ]n;  seizuresn  ];   paresthesias[y  ];  difficulty walking[ y ];  Psych:depression[  ]; anxiety[  ];  Endocrine: diabetes[  ];  thyroid dysfunction[  ];  Immunizations: Flu up to date [  ]; Pneumococcal up to date [  ];  Other:  Physical  Exam: BP (!) 146/77   Pulse 84   Resp 20   Ht '5\' 4"'$  (1.626 m)   Wt 148 lb (67.1 kg)   SpO2 98% Comment: RA  BMI 25.40 kg/m   PHYSICAL EXAMINATION: General appearance: alert, cooperative, appears stated age and mild distress Head: Normocephalic, without obvious abnormality, atraumatic Neck: no adenopathy, no carotid bruit, no JVD, supple, symmetrical, trachea midline and thyroid not enlarged, symmetric, no tenderness/mass/nodules Lymph nodes: Cervical, supraclavicular, and axillary nodes normal. Resp: clear to auscultation bilaterally Back: symmetric, no curvature. ROM normal. No CVA tenderness. Cardio: regular rate and rhythm, S1, S2 normal, no murmur, click, rub or gallop GI: soft, non-tender; bowel sounds normal; no masses,  no organomegaly Extremities: extremities normal, atraumatic, no cyanosis or edema Neurologic: Grossly normal Patient has soreness over the left shoulder and left upper arm, no chest wall soreness on the right or the left There is no bruising along his ribs on either side . Diagnostic Studies & Laboratory data:     Recent Radiology Findings:   Ct Chest Wo Contrast  Result Date: 11/19/2016 CLINICAL DATA:  Lung cancer with chemotherapy and radiation therapy, and prior right upper lobe wedge resection. Left chest pain. Rib fracture versus pulmonary contusion. EXAM: CT CHEST WITHOUT CONTRAST TECHNIQUE: Multidetector CT imaging of the chest was performed following the standard protocol without IV contrast. COMPARISON:  11/05/2013 chest CT FINDINGS: Cardiovascular: Coronary, aortic arch, and branch vessel atherosclerotic vascular disease. Calcified aortic and mitral valves. Mediastinum/Nodes: Lymph node anterior to the carina measures 1.0 cm in short axis, formerly 0.5 cm. Fullness of the right hilum, difficult separate potential no will tissue from vasculature due to lack of IV contrast. Subcarinal node short axis diameter 1.4 cm on image 62/2, formerly 0.9 cm.  Lungs/Pleura: 7.0 by 4.2 cm pleural-based mass on the right side in developing the prior postoperative the brachytherapy seeds/metal densities in the right lateral lung. This process is destroying the right fourth rib and invading the right chest wall. Old granulomatous disease is noted. Upper Abdomen: Old granulomatous disease involving the liver. Fluid density 2 cm lesion of the right kidney upper pole, likely a cyst. I do not see an adrenal mass. Exophytic 1.3 cm lesion of the left kidney upper pole, 9 Hounsfield unit density, partially imaged on 11/05/2013, probably incidental. Contracted gallbladder. Musculoskeletal: Destruction and invasion of part of the right fourth rib laterally due to the pleural based mass in this vicinity. Degenerative glenohumeral arthropathy bilaterally. Small lipoma in the right pectoralis major muscle. Mild deformities from old right lower lateral rib fractures. Old deformities from prior left posterolateral rib fractures which appear healed. Thoracic spondylosis. Minimal superior endplate concavity at the L1 level is new compared to 11/05/13 and may be due to a minimal compression fracture, age indeterminate. Atrophy of the right subscapularis muscle. IMPRESSION: 1.  Pleural based mass in the right mid lung surrounding the prior postoperative findings, measuring about 7.0 by 4.2 cm, with chest wall invasion and associated invasion and destruction of the right fourth rib. Appearance characteristic for recurrent malignancy. There is a mildly enlarged mediastinal lymph nodes. Possible hilar adenopathy on the right, difficult to characterize due to poor differentiation of nodal and vascular structures given the lack of IV contrast. 2. Coronary, aortic arch, and branch vessel atherosclerotic vascular disease. Ectatic ascending thoracic aorta. Aortic and mitral valve calcifications. Electronically Signed   By: Van Clines M.D.   On: 11/19/2016 14:44     I have independently  reviewed the above radiologic studies.  Recent Lab Findings: Lab Results  Component Value Date   WBC 10.4 10/22/2009   HGB 11.6 (L) 10/22/2009   HCT 33.7 (L) 10/22/2009   PLT 161 10/22/2009   GLUCOSE 169 (H) 10/22/2009   ALT 22 10/21/2009   AST 22 10/21/2009   NA 134 (L) 10/22/2009   K 4.3 10/22/2009   CL 98 10/22/2009   CREATININE 1.0 10/08/2013   BUN 18.9 10/08/2013   CO2 29 10/22/2009   INR 1.06 10/24/2009      Assessment / Plan:   Patient who presents with left shoulder and left arm pain and evidence on CT scan of recurrent right rib destruction with recurrent lung carcinoma previously resected adenocarcinoma right upper lobe with wedge resection and seed implantation. -Is not clear if the patient's left shoulder and left arm pain are related to metastatic bone metastasis or unrelated injury. But the patient does have definite evidence of enlarging right chest wall mass.  I discussed the findings of the CT scan with the patient and his daughter, he is a lifelong nonsmoker. We discussed the range of care from palliative care to more aggressive treatment in efforts at pain control. At this point the patient and his daughter are willing to proceed with, PET scan, needle biopsy of the right chest wall mass- with molecular/genetic testing, MRI of the brain to rule out metastasis. If we confirmed that the patient's pain in his shoulder and arm are related to metastatic disease consideration of palliative radiotherapy to this area.   We made arrangements to proceed with the outline testing above. The patient will be seen in the multidisciplinary thoracic oncology clinic for further follow-up and consideration of treatment.      I  spent 40 minutes counseling the patient face to face and 50% or more the  time was spent in counseling and coordination of care. The total time spent in the appointment was 60 minutes.  Grace Isaac MD      [address] [place]  [address] Office [phone]   Beeper [phone]  11/25/2016 5:12 PM

## 2016-11-26 ENCOUNTER — Other Ambulatory Visit: Payer: Self-pay | Admitting: *Deleted

## 2016-11-26 DIAGNOSIS — R918 Other nonspecific abnormal finding of lung field: Secondary | ICD-10-CM

## 2016-11-27 ENCOUNTER — Telehealth: Payer: Self-pay | Admitting: Oncology

## 2016-11-27 ENCOUNTER — Ambulatory Visit: Payer: PPO

## 2016-11-27 ENCOUNTER — Ambulatory Visit
Admission: RE | Admit: 2016-11-27 | Discharge: 2016-11-27 | Disposition: A | Discharge status: Home / Self Care | Payer: PPO | Source: Ambulatory Visit | Attending: Radiation Oncology | Admitting: Radiation Oncology

## 2016-11-27 ENCOUNTER — Other Ambulatory Visit: Payer: Self-pay | Admitting: General Surgery

## 2016-11-27 NOTE — Telephone Encounter (Signed)
[name] called and said that Allen [name] is having a lot of pain this morning in his left chest and she does not think she will be able to bring him in to see Dr. Sondra Come today.  She said he is scheduled for a PET scan and MRI of his brain tomorrow and a needle biopsy on Friday and is wondering if he should be seen after these tests.  Discussed with Dr. Sondra Come and rescheduled his appointment for 12/04/16 at 12:30.  [name] verbalized understanding and agreement.

## 2016-11-28 ENCOUNTER — Encounter
Admission: RE | Admit: 2016-11-28 | Discharge: 2016-11-28 | Disposition: A | Discharge status: Home / Self Care | Payer: PPO | Source: Ambulatory Visit | Attending: Cardiothoracic Surgery | Admitting: Cardiothoracic Surgery

## 2016-11-28 ENCOUNTER — Ambulatory Visit
Admission: RE | Admit: 2016-11-28 | Discharge: 2016-11-28 | Disposition: A | Discharge status: Home / Self Care | Payer: PPO | Source: Ambulatory Visit | Attending: Cardiothoracic Surgery | Admitting: Cardiothoracic Surgery

## 2016-11-28 DIAGNOSIS — R918 Other nonspecific abnormal finding of lung field: Secondary | ICD-10-CM | POA: Diagnosis present

## 2016-11-28 DIAGNOSIS — C3490 Malignant neoplasm of unspecified part of unspecified bronchus or lung: Secondary | ICD-10-CM | POA: Diagnosis not present

## 2016-11-28 DIAGNOSIS — C7951 Secondary malignant neoplasm of bone: Secondary | ICD-10-CM | POA: Insufficient documentation

## 2016-11-28 DIAGNOSIS — Z8673 Personal history of transient ischemic attack (TIA), and cerebral infarction without residual deficits: Secondary | ICD-10-CM | POA: Insufficient documentation

## 2016-11-28 LAB — GLUCOSE, CAPILLARY: Glucose-Capillary: 152 mg/dL — ABNORMAL HIGH (ref 65–99)

## 2016-11-28 MED ORDER — GADOBENATE DIMEGLUMINE 529 MG/ML IV SOLN
15.0000 mL | Freq: Once | INTRAVENOUS | Status: AC | PRN
  Administered 2016-11-28: 13 mL via INTRAVENOUS

## 2016-11-28 MED ORDER — FLUDEOXYGLUCOSE F - 18 (FDG) INJECTION
12.7000 | Freq: Once | INTRAVENOUS | Status: AC | PRN
  Administered 2016-11-28: 12.7 via INTRAVENOUS

## 2016-11-29 ENCOUNTER — Encounter (HOSPITAL_COMMUNITY): Payer: Self-pay

## 2016-11-29 ENCOUNTER — Ambulatory Visit (HOSPITAL_COMMUNITY)
Admission: RE | Admit: 2016-11-29 | Discharge: 2016-11-29 | Disposition: A | Discharge status: Home / Self Care | Payer: PPO | Source: Ambulatory Visit | Attending: Cardiothoracic Surgery | Admitting: Cardiothoracic Surgery

## 2016-11-29 ENCOUNTER — Ambulatory Visit (HOSPITAL_COMMUNITY): Admission: RE | Admit: 2016-11-29 | Payer: PPO | Source: Ambulatory Visit

## 2016-11-29 ENCOUNTER — Telehealth: Payer: Self-pay | Admitting: Cardiothoracic Surgery

## 2016-11-29 LAB — POCT I-STAT CREATININE: Creatinine, Ser: 0.8 mg/dL (ref 0.61–1.24)

## 2016-11-29 NOTE — Telephone Encounter (Signed)
Called patient daughter who was with him to review results of PET, Patient did not want to proceed with bx. Offered seeing radiation for palat ive treatment of pain ful mets . Patient and family preferred Hospice referral.   Grace Isaac MD      [address] [place] [address] Office [phone]   [place]

## 2016-12-04 ENCOUNTER — Telehealth: Payer: Self-pay | Admitting: Oncology

## 2016-12-04 ENCOUNTER — Ambulatory Visit: Payer: PPO

## 2016-12-04 ENCOUNTER — Ambulatory Visit: Payer: PPO | Admitting: Radiation Oncology

## 2016-12-04 NOTE — Telephone Encounter (Signed)
Allen [name] called back and said they have hospice for her dad now.  He is not interested in any more treatment.

## 2016-12-04 NOTE — Telephone Encounter (Signed)
Left a message for Allen [name] to see if [name] would still like to have radiation.  Requested a return call.

## 2016-12-30 DEATH — deceased

## 2017-01-16 ENCOUNTER — Ambulatory Visit: Payer: PPO | Admitting: Podiatry
